# Patient Record
Sex: Female | Born: 1950
Health system: Southern US, Community
[De-identification: ages and names within clinical notes are randomized; demographics above are authoritative.]

## PROBLEM LIST (undated history)

## (undated) DIAGNOSIS — E559 Vitamin D deficiency, unspecified: Secondary | ICD-10-CM

## (undated) DIAGNOSIS — R200 Anesthesia of skin: Secondary | ICD-10-CM

## (undated) DIAGNOSIS — M1990 Unspecified osteoarthritis, unspecified site: Secondary | ICD-10-CM

## (undated) DIAGNOSIS — I10 Essential (primary) hypertension: Secondary | ICD-10-CM

## (undated) DIAGNOSIS — G4730 Sleep apnea, unspecified: Secondary | ICD-10-CM

## (undated) DIAGNOSIS — K219 Gastro-esophageal reflux disease without esophagitis: Secondary | ICD-10-CM

## (undated) DIAGNOSIS — E785 Hyperlipidemia, unspecified: Secondary | ICD-10-CM

## (undated) DIAGNOSIS — M545 Low back pain, unspecified: Secondary | ICD-10-CM

## (undated) DIAGNOSIS — E119 Type 2 diabetes mellitus without complications: Secondary | ICD-10-CM

## (undated) DIAGNOSIS — E039 Hypothyroidism, unspecified: Secondary | ICD-10-CM

## (undated) DIAGNOSIS — E7800 Pure hypercholesterolemia, unspecified: Secondary | ICD-10-CM

## (undated) HISTORY — DX: Low back pain: M54.5

## (undated) HISTORY — PX: OTHER SURGICAL HISTORY: SHX169

## (undated) HISTORY — DX: Gastro-esophageal reflux disease without esophagitis: K21.9

## (undated) HISTORY — PX: SALPINGECTOMY: SHX328

## (undated) HISTORY — DX: Hyperlipidemia, unspecified: E78.5

## (undated) HISTORY — DX: Low back pain, unspecified: M54.50

## (undated) HISTORY — DX: Vitamin D deficiency, unspecified: E55.9

## (undated) HISTORY — PX: HAMMER TOE SURGERY: SHX385

## (undated) HISTORY — DX: Pure hypercholesterolemia, unspecified: E78.00

---

## 1979-10-13 HISTORY — PX: ABDOMINAL HYSTERECTOMY: SHX81

## 1998-01-10 ENCOUNTER — Other Ambulatory Visit: Admission: RE | Admit: 1998-01-10 | Discharge: 1998-01-10 | Payer: Self-pay | Admitting: Endocrinology

## 1999-12-15 ENCOUNTER — Ambulatory Visit (HOSPITAL_COMMUNITY): Admission: RE | Admit: 1999-12-15 | Discharge: 1999-12-15 | Payer: Self-pay | Admitting: *Deleted

## 1999-12-15 ENCOUNTER — Encounter (INDEPENDENT_AMBULATORY_CARE_PROVIDER_SITE_OTHER): Payer: Self-pay | Admitting: *Deleted

## 2000-02-08 ENCOUNTER — Ambulatory Visit: Admission: RE | Admit: 2000-02-08 | Discharge: 2000-02-08 | Payer: Self-pay | Admitting: Endocrinology

## 2001-02-03 ENCOUNTER — Other Ambulatory Visit: Admission: RE | Admit: 2001-02-03 | Discharge: 2001-02-03 | Payer: Self-pay | Admitting: Endocrinology

## 2001-06-23 ENCOUNTER — Encounter: Payer: Self-pay | Admitting: Endocrinology

## 2001-06-23 ENCOUNTER — Ambulatory Visit (HOSPITAL_COMMUNITY): Admission: RE | Admit: 2001-06-23 | Discharge: 2001-06-23 | Payer: Self-pay | Admitting: Endocrinology

## 2001-12-25 ENCOUNTER — Ambulatory Visit (HOSPITAL_BASED_OUTPATIENT_CLINIC_OR_DEPARTMENT_OTHER): Admission: RE | Admit: 2001-12-25 | Discharge: 2001-12-25 | Payer: Self-pay | Admitting: Pulmonary Disease

## 2002-03-21 ENCOUNTER — Encounter: Payer: Self-pay | Admitting: Endocrinology

## 2002-03-21 ENCOUNTER — Encounter: Admission: RE | Admit: 2002-03-21 | Discharge: 2002-03-21 | Payer: Self-pay | Admitting: Endocrinology

## 2002-04-24 ENCOUNTER — Encounter: Payer: Self-pay | Admitting: Internal Medicine

## 2002-04-24 ENCOUNTER — Encounter: Admission: RE | Admit: 2002-04-24 | Discharge: 2002-04-24 | Payer: Self-pay | Admitting: Internal Medicine

## 2002-05-30 ENCOUNTER — Encounter (INDEPENDENT_AMBULATORY_CARE_PROVIDER_SITE_OTHER): Payer: Self-pay | Admitting: Specialist

## 2002-05-30 ENCOUNTER — Ambulatory Visit (HOSPITAL_COMMUNITY): Admission: RE | Admit: 2002-05-30 | Discharge: 2002-05-30 | Payer: Self-pay | Admitting: *Deleted

## 2002-05-30 ENCOUNTER — Encounter: Payer: Self-pay | Admitting: *Deleted

## 2004-05-26 ENCOUNTER — Other Ambulatory Visit: Admission: RE | Admit: 2004-05-26 | Discharge: 2004-05-26 | Payer: Self-pay | Admitting: Endocrinology

## 2004-05-30 ENCOUNTER — Encounter (INDEPENDENT_AMBULATORY_CARE_PROVIDER_SITE_OTHER): Payer: Self-pay | Admitting: *Deleted

## 2004-05-30 ENCOUNTER — Ambulatory Visit (HOSPITAL_COMMUNITY): Admission: RE | Admit: 2004-05-30 | Discharge: 2004-05-30 | Payer: Self-pay | Admitting: *Deleted

## 2005-09-08 ENCOUNTER — Encounter (INDEPENDENT_AMBULATORY_CARE_PROVIDER_SITE_OTHER): Payer: Self-pay | Admitting: *Deleted

## 2005-09-08 ENCOUNTER — Ambulatory Visit (HOSPITAL_COMMUNITY): Admission: RE | Admit: 2005-09-08 | Discharge: 2005-09-08 | Payer: Self-pay | Admitting: *Deleted

## 2007-06-03 ENCOUNTER — Other Ambulatory Visit: Admission: RE | Admit: 2007-06-03 | Discharge: 2007-06-03 | Payer: Self-pay | Admitting: Endocrinology

## 2007-12-30 ENCOUNTER — Encounter (INDEPENDENT_AMBULATORY_CARE_PROVIDER_SITE_OTHER): Payer: Self-pay | Admitting: *Deleted

## 2007-12-30 ENCOUNTER — Ambulatory Visit (HOSPITAL_COMMUNITY): Admission: RE | Admit: 2007-12-30 | Discharge: 2007-12-30 | Payer: Self-pay | Admitting: *Deleted

## 2008-06-06 ENCOUNTER — Other Ambulatory Visit: Admission: RE | Admit: 2008-06-06 | Discharge: 2008-06-06 | Payer: Self-pay | Admitting: Endocrinology

## 2009-02-20 ENCOUNTER — Encounter: Admission: RE | Admit: 2009-02-20 | Discharge: 2009-02-20 | Payer: Self-pay | Admitting: Endocrinology

## 2009-06-10 ENCOUNTER — Other Ambulatory Visit: Admission: RE | Admit: 2009-06-10 | Discharge: 2009-06-10 | Payer: Self-pay | Admitting: Endocrinology

## 2010-05-12 ENCOUNTER — Encounter: Admission: RE | Admit: 2010-05-12 | Discharge: 2010-05-12 | Payer: Self-pay | Admitting: Endocrinology

## 2010-07-21 ENCOUNTER — Ambulatory Visit: Payer: Self-pay | Admitting: Radiology

## 2010-07-21 ENCOUNTER — Emergency Department (HOSPITAL_BASED_OUTPATIENT_CLINIC_OR_DEPARTMENT_OTHER): Admission: EM | Admit: 2010-07-21 | Discharge: 2010-07-21 | Payer: Self-pay | Admitting: Emergency Medicine

## 2010-07-29 ENCOUNTER — Other Ambulatory Visit: Admission: RE | Admit: 2010-07-29 | Discharge: 2010-07-29 | Payer: Self-pay | Admitting: Endocrinology

## 2010-12-25 LAB — DIFFERENTIAL
Basophils Absolute: 0.2 10*3/uL — ABNORMAL HIGH (ref 0.0–0.1)
Basophils Relative: 3 % — ABNORMAL HIGH (ref 0–1)
Monocytes Relative: 8 % (ref 3–12)
Neutro Abs: 2.4 10*3/uL (ref 1.7–7.7)
Neutrophils Relative %: 53 % (ref 43–77)

## 2010-12-25 LAB — CBC
HCT: 39.6 % (ref 36.0–46.0)
MCH: 28.1 pg (ref 26.0–34.0)
MCV: 87.6 fL (ref 78.0–100.0)
RDW: 12.6 % (ref 11.5–15.5)
WBC: 4.8 10*3/uL (ref 4.0–10.5)

## 2010-12-25 LAB — COMPREHENSIVE METABOLIC PANEL
Alkaline Phosphatase: 95 U/L (ref 39–117)
BUN: 8 mg/dL (ref 6–23)
Creatinine, Ser: 0.5 mg/dL (ref 0.4–1.2)
Glucose, Bld: 134 mg/dL — ABNORMAL HIGH (ref 70–99)
Potassium: 4.7 mEq/L (ref 3.5–5.1)
Total Bilirubin: 0.9 mg/dL (ref 0.3–1.2)
Total Protein: 8.5 g/dL — ABNORMAL HIGH (ref 6.0–8.3)

## 2010-12-25 LAB — POCT CARDIAC MARKERS: Troponin i, poc: <0.05 ng/mL (ref 0.00–0.09)

## 2010-12-25 LAB — LIPASE, BLOOD: Lipase: 158 U/L (ref 23–300)

## 2011-02-24 NOTE — Op Note (Signed)
NAMEBRYONNA, SUNDBY                ACCOUNT NO.:  [id number]   MEDICAL RECORD NO.:  [id number]          PATIENT TYPE:  AMB   LOCATION:  ENDO                         FACILITY:  [facility]   PHYSICIAN:  Georgiana Spinner, M.D.    DATE OF BIRTH:  1951/03/12   DATE OF PROCEDURE:  12/30/2007  DATE OF DISCHARGE:                               OPERATIVE REPORT   PROCEDURE:  Upper endoscopy.   INDICATIONS:  GERD.   ANESTHESIA:  Fentanyl 50 mcg, Versed 8 mg.   PROCEDURE:  With the patient mildly sedated in the left lateral  decubitus, the position the Pentax videoscopic endoscope was inserted in  the mouth and passed under direct vision through the esophagus into the  stomach.  Fundus, body, antrum, duodenal bulb, second portion duodenum  were visualized.  From this point the endoscope was slowly withdrawn  taking circumferential views of the duodenal mucosa until the endoscope  had been pulled back into the stomach and placed in retroflexion to view  the stomach from below.  The endoscope was then straightened and  withdrawn taking circumferential views of the remaining gastric and  esophageal mucosa stopping in the fundus of the stomach where erythema  was seen photographed and biopsied.  We then stopped in the distal  esophagus where I could not get the esophagus to fully open but I  thought there could be Barrett's esophagus, photographed and biopsied.  The endoscope was withdrawn.  The patient's vital signs and pulse  oximeter remained stable.  The patient tolerated the procedure well  without apparent complication.   FINDINGS:  Question of Barrett's esophagus, erythema of gastric fundus,  await biopsy reports.  The patient will call me for results and follow  up with me as an outpatient.           ______________________________  Georgiana Spinner, M.D.     GMO/MEDQ  D:  12/30/2007  T:  12/30/2007  Job:  [id number]

## 2011-02-27 NOTE — Op Note (Signed)
NAMETACY, CHAVIS                ACCOUNT NO.:  [id number]   MEDICAL RECORD NO.:  [id number]          PATIENT TYPE:  AMB   LOCATION:  ENDO                         FACILITY:  [facility]   PHYSICIAN:  Georgiana Spinner, M.D.    DATE OF BIRTH:  1951-04-16   DATE OF PROCEDURE:  09/08/2005  DATE OF DISCHARGE:                                 OPERATIVE REPORT   PROCEDURE:  Upper endoscopy with biopsy.   INDICATIONS:  GERD.   ANESTHESIA:  Demerol 60 Versed 6 mg.   PROCEDURE:  With the patient mildly sedated in the left lateral decubitus  position, the Olympus videoscopic endoscope was inserted into the mouth and  passed, under direct vision, through the esophagus which appeared normal  until it reached the distal esophagus; and there was a question of  Barrett's; photographed and biopsied. We entered into the stomach, fundus,  body, antrum, duodenal bulb, second portion of the duodenum and visualized.  From this point the endoscope was slowly withdrawn taking circumferential  views of the duodenal mucosa until the endoscope had been pulled back into  the stomach and placed on retroflexed view of the stomach from below. The  endoscope was straightened, and withdrawn taking circumferential views of  the mid gastric and esophageal mucosa; stopping to biopsy a question of a  polyp on the __________.  The endoscope was then withdrawn. The patient's  vital signs and pulse oximetry remained stable. The patient tolerated the  procedure well without complications.   Question of Barrett's esophagus __________ .  Await biopsy reports. The  patient will call me for results and followup with me as an outpatient.           ______________________________  Georgiana Spinner, M.D.     GMO/MEDQ  D:  09/08/2005  T:  09/08/2005  Job:  [id number]

## 2011-02-27 NOTE — Op Note (Signed)
NAME:  Jean Hamilton, Jean Hamilton                          ACCOUNT NO.:  [id number]   MEDICAL RECORD NO.:  [id number]                   PATIENT TYPE:  AMB   LOCATION:  ENDO                                 FACILITY:  [facility]   PHYSICIAN:  Georgiana Spinner, M.D.                 DATE OF BIRTH:  [birth year]   DATE OF PROCEDURE:  DATE OF DISCHARGE:                                 OPERATIVE REPORT   PROCEDURE:  Upper endoscopy with biopsy.   INDICATIONS:  Barrett's.   ANESTHESIA:  Demerol 50, Versed 5 mg.   PROCEDURE:  With the patient mildly sedated in the left lateral decubitus  position, the Olympus videoscopic endoscope was inserted into the mouth,  passed under good occlusion through the esophagus, which appeared normal,  until we reached the distal esophagus.  There were changes of Barrett's.  Photographed and biopsied.  We entered into the stomach.  The fundus, body,  antrum, duodenal bulb, and second portion of the duodenum were visualized.  From this point, the endoscope was slowly withdrawn, taking circumferential  views of the duodenal mucosa.  The endoscope was pulled back into the  stomach and placed in retroflexion, viewing the stomach from below.  The  endoscope was straightened and withdrawn, taking circumferential views of  the remaining gastric and esophageal mucosa.  Patient's vital signs and  pulse oximeter remained stable.  The patient tolerated the procedure well  without apparent complications.   FINDINGS:  Barrett's esophagus, biopsied.   Await biopsy report.  Patient is to call me for the results and follow up  with me as an outpatient.                                               Georgiana Spinner, M.D.    GMO/MEDQ  D:  05/30/2004  T:  05/30/2004  Job:  [id number]

## 2011-02-27 NOTE — Op Note (Signed)
NAME:  Jean Hamilton, Jean Hamilton                          ACCOUNT NO.:  [id number]   MEDICAL RECORD NO.:  [id number]                   PATIENT TYPE:  AMB   LOCATION:  ENDO                                 [facility]ACILITY:  [facility]   PHYSICIAN:  Georgiana Spinner, M.D.                 DATE OF BIRTH:  18-May-1951   DATE OF PROCEDURE:  DATE OF DISCHARGE:                                 OPERATIVE REPORT   PROCEDURE:  Colonoscopy.   INDICATIONS:  Colon cancer screening.   ANESTHESIA:  Demerol 30, Versed 3 mg.   PROCEDURE:  With the patient mildly sedated in the left lateral decubitus  position, the Olympus videoscopic colonoscope was inserted into the rectum  and passed through a somewhat tortuous colon to the cecum, identified by  ileocecal valve and appendiceal orifice, both of which were photographed.  From this point, the colonoscope was then slowly withdrawn, taking  circumferential views of the colonic mucosa, stopping only in the rectum,  which appeared normal on direct and showed hemorrhoids on retroflex view.  The endoscopy was straightened and withdrawn.  Patient's vital signs and  pulse oximetry remained stable.  Patient tolerated the procedure well  without apparent complications.   FINDINGS:  Internal hemorrhoids, otherwise unremarkable colonoscopic  examination.   PLAN:  See endoscopy note for further details.                                               Georgiana Spinner, M.D.    GMO/MEDQ  D:  05/30/2004  T:  05/30/2004  Job:  [id number]

## 2011-02-27 NOTE — Procedures (Signed)
[place]  Patient:    Jean Hamilton, Jean Hamilton                       MRN: [id number] Proc. Date: 12/15/99 Adm. Date:  [id number] Attending:  Sabino Gasser                           Procedure Report  PROCEDURE:  Upper endoscopy with biopsy.  INDICATIONS:  Reflux symptomatology.  ANESTHESIA:  Demerol 50 mg, Versed 5 mg was given intravenously in divided dose.  PROCEDURE:  With patient mildly sedated in the left lateral decubitus position, the Olympus videoscopic endoscope was inserted in the mouth and passed under direct  vision through the esophagus, which appeared normal until we reached the distal  esophagus, where there was a question of one area of Barretts esophagus, photographed and biopsied.  When entered into the stomach, the antrum was approached, appeared normal, as did duodenal bulb, second portion of duodenum.  From this point, the endoscope was slowly withdrawn, taking circumferential views of the entire duodenal mucosa until the endoscope had been pulled back into the  stomach, placed on retroflexion to view the stomach from below and there was a question of a small hiatal hernia as evidenced by incomplete wrap of the GE junction around the endoscope.  The endoscope was straightened and withdrawn taking circumferential views of the entire gastric and subsequently esophageal mucosa nd there was the beginning of possible and early stricture formation photographed.  The endoscope was then withdrawn.  Patients vital signs and pulse oximeter remained stable.  The patient tolerated the procedure well without apparent complications.  FINDINGS:  Question of small hiatal hernia.  Question of Barretts esophagus. Await biopsy report.  Will have patient follow up with me as an outpatient. DD:  12/15/99 TD:  12/15/99 Job: [id number]

## 2011-10-29 IMAGING — CR DG CHEST 2V
2 series · 2 of 2 positions shown · non-contrast
Comparison: None.

CLINICAL DATA: Left chest pain

CHEST - 2 VIEW

[w chest pa]
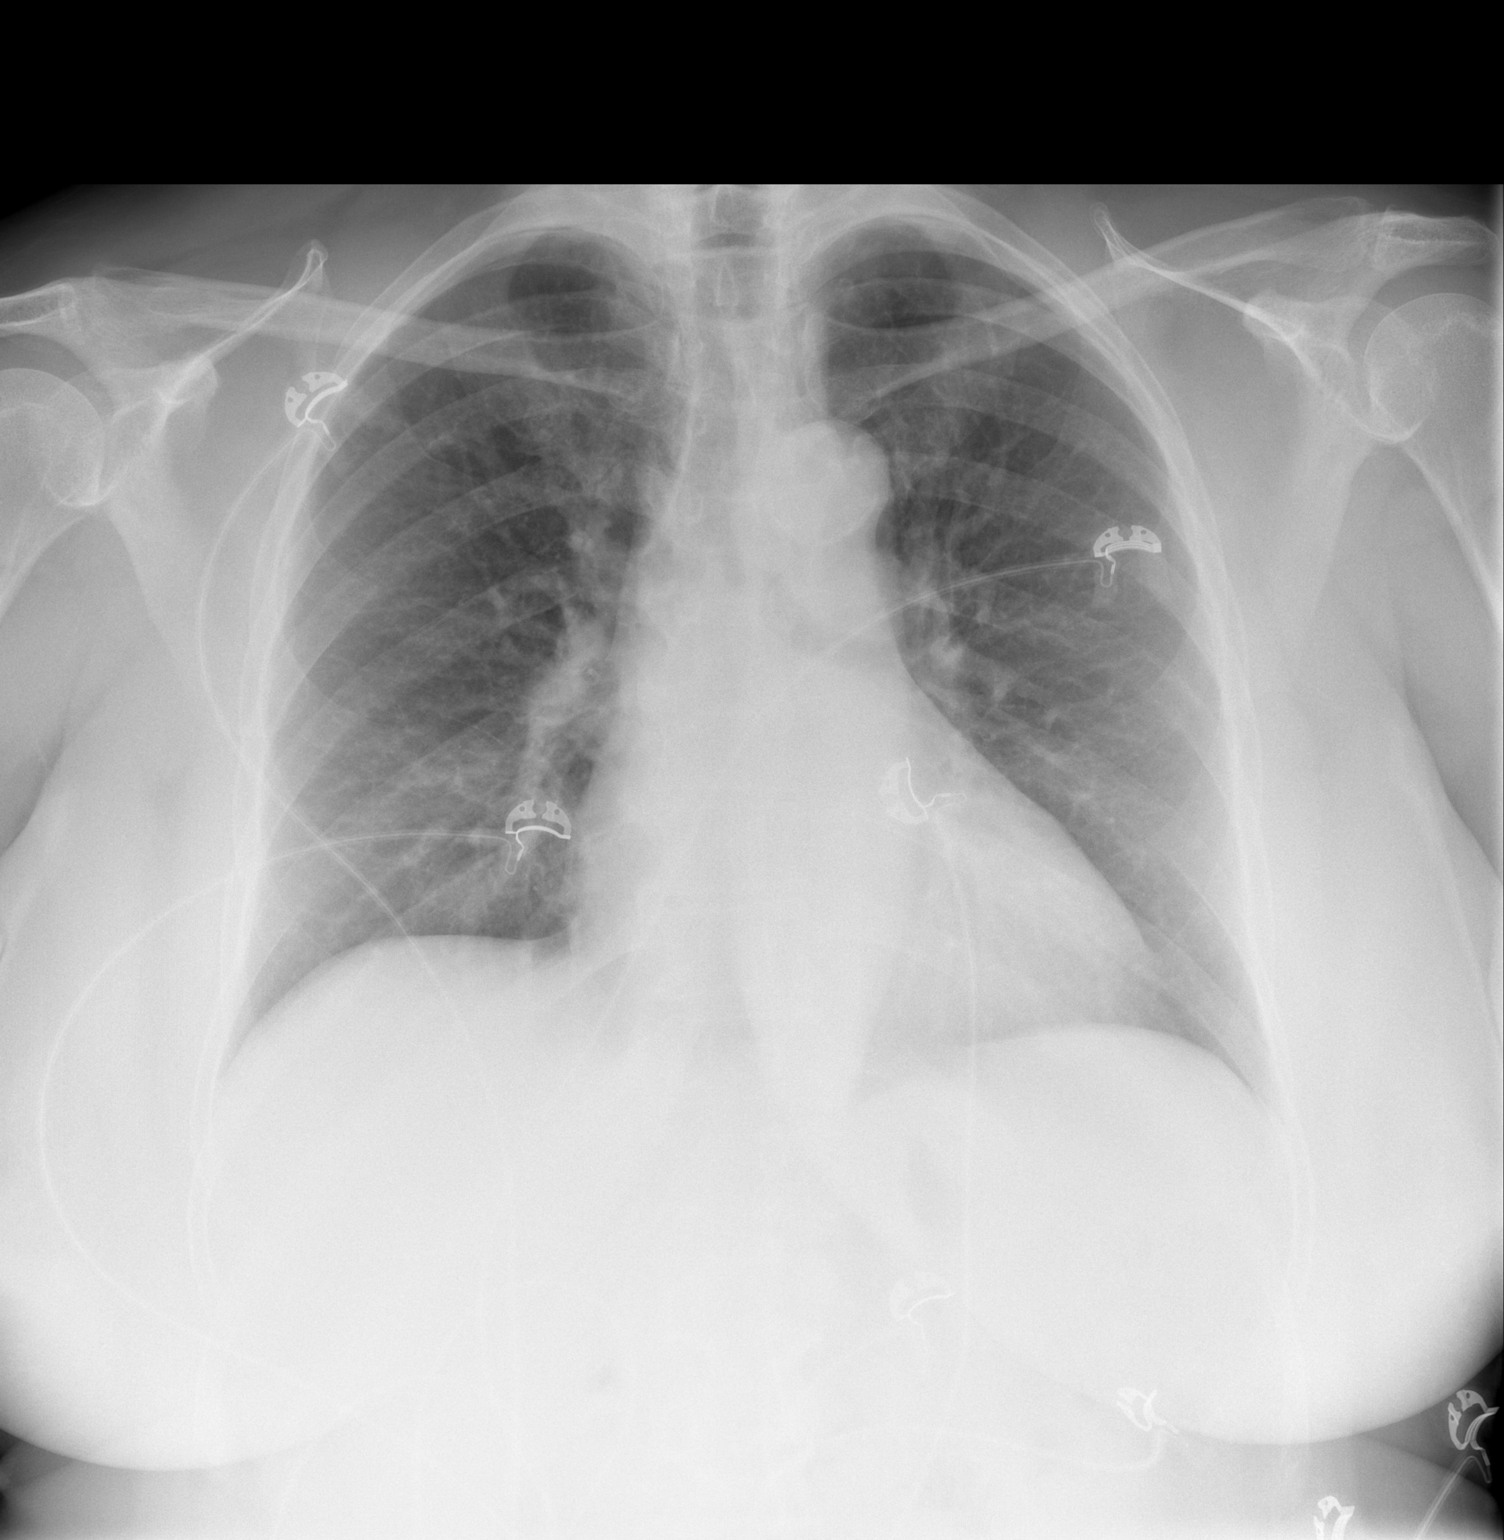

[w chest lat]
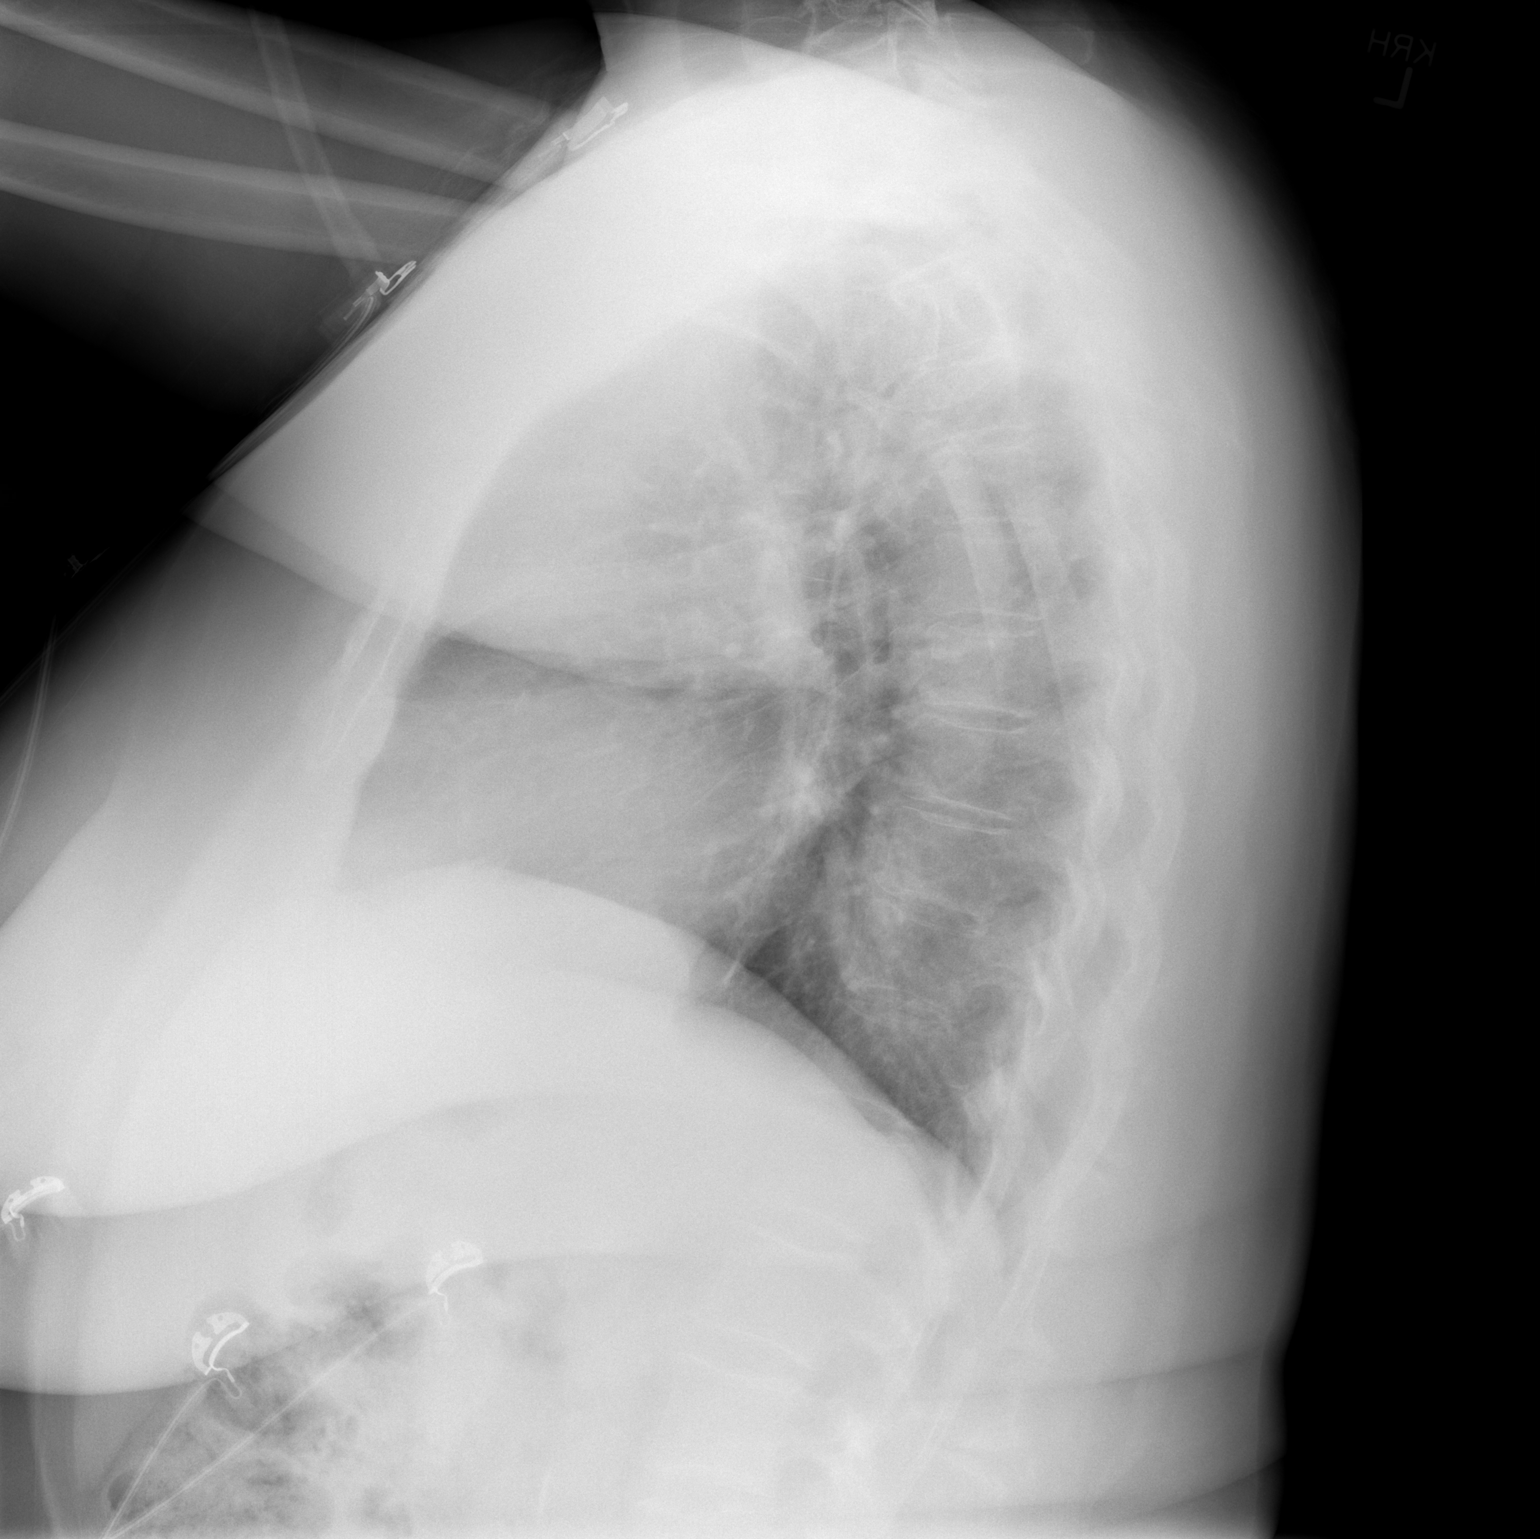

[2 of 2 positions shown; findings below may reference images not displayed]

FINDINGS: Heart size slightly increased .  Lungs clear.  No
congestive heart failure or pleural fluid.  Osseous structures
intact.
IMPRESSION: Slight cardiomegaly - no active disease.

## 2013-10-31 ENCOUNTER — Other Ambulatory Visit: Payer: Self-pay | Admitting: Endocrinology

## 2013-10-31 DIAGNOSIS — M5430 Sciatica, unspecified side: Secondary | ICD-10-CM

## 2013-11-08 ENCOUNTER — Ambulatory Visit
Admission: RE | Admit: 2013-11-08 | Discharge: 2013-11-08 | Disposition: A | Discharge status: Home / Self Care | Payer: Managed Care, Other (non HMO) | Source: Ambulatory Visit | Attending: Endocrinology | Admitting: Endocrinology

## 2013-11-08 DIAGNOSIS — M5430 Sciatica, unspecified side: Secondary | ICD-10-CM

## 2013-12-22 NOTE — Patient Instructions (Signed)
[place]  12/22/2013   Your procedure is scheduled on: 01/03/14  Report to [facility] at 7:10 AM.  Call this number if you have problems the morning of surgery 336-: [phone]   Remember:   Do not eat food or drink liquids After Midnight.     Take these medicines the morning of surgery with A SIP OF WATER:    Do not wear jewelry, make-up or nail polish.  Do not wear lotions, powders, or perfumes. You may wear deodorant.  Do not shave 48 hours prior to surgery. Men may shave face and neck.  Do not bring valuables to the hospital.  Contacts, dentures or bridgework may not be worn into surgery.  Leave suitcase in the car. After surgery it may be brought to your room.  For patients admitted to the hospital, checkout time is 11:00 AM the day of discharge.    Please read over the following fact sheets that you were given:[place] preparing for surgery sheet, MRSA Information, incentive spirometry fact sheet, blood fact sheet Paulette Blanch, RN  pre op nurse call if needed [phone]    FAILURE TO [place]   Patient Signature: ___________________________________________

## 2013-12-22 NOTE — Progress Notes (Signed)
Surgery clearance note Dr. Wilson Singer 12/15/13 on chart, EKG 12/15/13 on chart

## 2013-12-22 NOTE — Progress Notes (Signed)
Surgery on 01/03/13.  Preop on 12/25/13 at 0900am.  Need orders in EPIC.  Thank You.

## 2013-12-25 ENCOUNTER — Inpatient Hospital Stay (HOSPITAL_COMMUNITY)
Admission: RE | Admit: 2013-12-25 | Discharge: 2013-12-25 | Disposition: A | Discharge status: Home / Self Care | Payer: Managed Care, Other (non HMO) | Source: Ambulatory Visit

## 2013-12-25 ENCOUNTER — Encounter (HOSPITAL_COMMUNITY): Payer: Self-pay | Admitting: Pharmacy Technician

## 2013-12-28 ENCOUNTER — Other Ambulatory Visit (HOSPITAL_COMMUNITY): Payer: Self-pay | Admitting: Orthopedic Surgery

## 2013-12-28 ENCOUNTER — Ambulatory Visit (HOSPITAL_COMMUNITY)
Admission: RE | Admit: 2013-12-28 | Discharge: 2013-12-28 | Disposition: A | Discharge status: Home / Self Care | Payer: Managed Care, Other (non HMO) | Source: Ambulatory Visit | Attending: Surgical | Admitting: Surgical

## 2013-12-28 ENCOUNTER — Encounter (HOSPITAL_COMMUNITY): Payer: Self-pay

## 2013-12-28 ENCOUNTER — Encounter (HOSPITAL_COMMUNITY)
Admission: RE | Admit: 2013-12-28 | Discharge: 2013-12-28 | Disposition: A | Discharge status: Home / Self Care | Payer: Managed Care, Other (non HMO) | Source: Ambulatory Visit | Attending: Orthopedic Surgery | Admitting: Orthopedic Surgery

## 2013-12-28 DIAGNOSIS — Z01818 Encounter for other preprocedural examination: Secondary | ICD-10-CM | POA: Insufficient documentation

## 2013-12-28 DIAGNOSIS — I10 Essential (primary) hypertension: Secondary | ICD-10-CM | POA: Insufficient documentation

## 2013-12-28 DIAGNOSIS — Z01812 Encounter for preprocedural laboratory examination: Secondary | ICD-10-CM | POA: Insufficient documentation

## 2013-12-28 HISTORY — DX: Gastro-esophageal reflux disease without esophagitis: K21.9

## 2013-12-28 HISTORY — DX: Essential (primary) hypertension: I10

## 2013-12-28 HISTORY — DX: Unspecified osteoarthritis, unspecified site: M19.90

## 2013-12-28 HISTORY — DX: Sleep apnea, unspecified: G47.30

## 2013-12-28 HISTORY — DX: Hypothyroidism, unspecified: E03.9

## 2013-12-28 HISTORY — DX: Type 2 diabetes mellitus without complications: E11.9

## 2013-12-28 HISTORY — DX: Anesthesia of skin: R20.0

## 2013-12-28 LAB — URINALYSIS, ROUTINE W REFLEX MICROSCOPIC
Bilirubin Urine: NEGATIVE
Glucose, UA: NEGATIVE mg/dL
Hgb urine dipstick: NEGATIVE
Ketones, ur: NEGATIVE mg/dL
Leukocytes, UA: NEGATIVE
Nitrite: NEGATIVE
Protein, ur: NEGATIVE mg/dL
Specific Gravity, Urine: 1.013 (ref 1.005–1.030)
Urobilinogen, UA: 0.2 mg/dL (ref 0.0–1.0)
pH: 5.5 (ref 5.0–8.0)

## 2013-12-28 LAB — CBC
HCT: 35.8 % — ABNORMAL LOW (ref 36.0–46.0)
Hemoglobin: 12.3 g/dL (ref 12.0–15.0)
MCH: 28.9 pg (ref 26.0–34.0)
MCHC: 34.4 g/dL (ref 30.0–36.0)
MCV: 84 fL (ref 78.0–100.0)
Platelets: 167 10*3/uL (ref 150–400)
RBC: 4.26 MIL/uL (ref 3.87–5.11)
RDW: 12.7 % (ref 11.5–15.5)
WBC: 6.2 10*3/uL (ref 4.0–10.5)

## 2013-12-28 LAB — COMPREHENSIVE METABOLIC PANEL
ALT: 27 U/L (ref 0–35)
AST: 27 U/L (ref 0–37)
Albumin: 4.1 g/dL (ref 3.5–5.2)
Alkaline Phosphatase: 69 U/L (ref 39–117)
BUN: 10 mg/dL (ref 6–23)
CO2: 24 mEq/L (ref 19–32)
Calcium: 10.1 mg/dL (ref 8.4–10.5)
Chloride: 101 mEq/L (ref 96–112)
Creatinine, Ser: 0.64 mg/dL (ref 0.50–1.10)
GFR calc Af Amer: >90 mL/min (ref >90)
GFR calc non Af Amer: >90 mL/min (ref >90)
Glucose, Bld: 106 mg/dL — ABNORMAL HIGH (ref 70–99)
Potassium: 4.3 mEq/L (ref 3.7–5.3)
Sodium: 139 mEq/L (ref 137–147)
Total Bilirubin: 0.3 mg/dL (ref 0.3–1.2)
Total Protein: 8.4 g/dL — ABNORMAL HIGH (ref 6.0–8.3)

## 2013-12-28 LAB — SURGICAL PCR SCREEN
MRSA, PCR: NEGATIVE
STAPHYLOCOCCUS AUREUS: NEGATIVE

## 2013-12-28 LAB — PROTIME-INR
INR: 1.03 (ref 0.00–1.49)
Prothrombin Time: 13.3 seconds (ref 11.6–15.2)

## 2013-12-28 LAB — APTT: aPTT: 31 seconds (ref 24–37)

## 2013-12-28 NOTE — Progress Notes (Signed)
ekg 12-15-13 dr Wilson Singer on chart Medical clearance note dr Wilson Singer 12-15-13 on chart

## 2013-12-28 NOTE — Patient Instructions (Signed)
[place]  12/28/2013   Your procedure is scheduled on:  Wednesday March 25th  Report to [facility] at  710 AM.  Call this number if you have problems the morning of surgery [phone]   Remember:  Do not eat food or drink liquids :After Midnight.     Take these medicines the morning of surgery with A SIP OF WATER: Nexium, Synthroid, Simvastatin                                [place]             You may not have any metal on your body including hair pins and piercings  Do not wear jewelry, make-up.  Do not wear lotions, powders, or perfumes. No  Deodorant is to be worn.   Men may shave face and neck.  Do not bring valuables to the hospital. [place].  Contacts, dentures or bridgework may not be worn into surgery.  Leave suitcase in the car. After surgery it may be brought to your room.  For patients admitted to the hospital, checkout time is 11:00 AM the day of discharge.    Please read over the following fact sheets that you were given: [facility] Preparing for surgery sheet,  incentive spirometer sheet, blood fact sheet, MRSA information  Call Zelphia Cairo RN pre op nurse if needed [phone]    [place].  PATIENT SIGNATURE___________________________________________  NURSE SIGNATURE_____________________________________________

## 2014-01-01 NOTE — H&P (Signed)
TOTAL HIP ADMISSION H&P  Patient is admitted for right total hip arthroplasty.  Subjective:  Chief Complaint: right hip pain  HPI: Jean Hamilton, 63 y.o. female, has a history of pain and functional disability in the right hip(s) due to arthritis and patient has failed non-surgical conservative treatments for greater than 12 weeks to include NSAID's and/or analgesics, corticosteriod injections, use of assistive devices and activity modification.  Onset of symptoms was gradual starting 4 years ago with gradually worsening course since that time.The patient noted no past surgery on the right hip(s).  Patient currently rates pain in the right hip at 8 out of 10 with activity. Patient has night pain, worsening of pain with activity and weight bearing, pain that interfers with activities of daily living, pain with passive range of motion and crepitus. Patient has evidence of periarticular osteophytes and joint space narrowing by imaging studies. This condition presents safety issues increasing the risk of falls.  There is no current active infection.   Past Medical History  Diagnosis Date  . Hypertension   . Sleep apnea     no cpap use since weight loss about 10 yrs ago  . Hypothyroidism   . Diabetes mellitus without complication   . GERD (gastroesophageal reflux disease)   . Arthritis     oa  . Numbness last few weeks    left index finger    Past Surgical History  Procedure Laterality Date  . Abdominal hysterectomy  1981    1 ovary removed  . Ganglion cyst removed Right yrs ago    wrist  . Hammer toe surgery Left few yrs ago    and left foot surgery     Current outpatient prescriptions: amLODipine-valsartan (EXFORGE) 5-320 MG per tablet, Take 1 tablet by mouth every morning., Disp: , Rfl: ;   aspirin EC 81 MG tablet, Take 81 mg by mouth daily., Disp: , Rfl: ;   Calcium-Magnesium-Zinc 500-250-12.5 MG TABS, Take 1 tablet by mouth daily., Disp: , Rfl: ;   cholecalciferol (VITAMIN D)  1000 UNITS tablet, Take 1,000 Units by mouth daily., Disp: , Rfl:  esomeprazole (NEXIUM) 20 MG capsule, Take 20 mg by mouth daily at 12 noon., Disp: , Rfl: ;   HYDROcodone-acetaminophen (NORCO/VICODIN) 5-325 MG per tablet, Take 1 tablet by mouth 4 (four) times daily as needed for moderate pain., Disp: , Rfl: ;   levothyroxine (SYNTHROID, LEVOTHROID) 150 MCG tablet, Take 150 mcg by mouth daily before breakfast., Disp: , Rfl:  metFORMIN (GLUCOPHAGE-XR) 500 MG 24 hr tablet, Take 1,000 mg by mouth daily with breakfast., Disp: , Rfl: ;   potassium gluconate 595 MG TABS tablet, Take 595 mg by mouth daily., Disp: , Rfl: ;   simvastatin (ZOCOR) 40 MG tablet, Take 40 mg by mouth every morning., Disp: , Rfl: ;  vitamin E 400 UNIT capsule, Take 400 Units by mouth daily., Disp: , Rfl:   Allergies  Allergen Reactions  . Sulfa Antibiotics Swelling    Fever     History  Substance Use Topics  . Smoking status: Former Smoker -- 0.25 packs/day for 20 years    Types: Cigarettes    Quit date: 10/12/1993  . Smokeless tobacco: Never Used  . Alcohol Use: No      Review of Systems  Constitutional: Negative.   HENT: Negative.   Eyes: Negative.   Respiratory: Negative.   Cardiovascular: Negative.   Gastrointestinal: Negative.   Genitourinary: Negative.   Musculoskeletal: Positive for back pain and joint  pain. Negative for falls, myalgias and neck pain.       Right hip pain  Skin: Negative.   Neurological: Negative.   Endo/Heme/Allergies: Negative.   Psychiatric/Behavioral: Negative.     Objective:  Physical Exam  Constitutional: She is oriented to person, place, and time. She appears well-developed and well-nourished. No distress.  HENT:  Head: Normocephalic and atraumatic.  Right Ear: External ear normal.  Left Ear: External ear normal.  Nose: Nose normal.  Mouth/Throat: Oropharynx is clear and moist.  Eyes: Conjunctivae and EOM are normal.  Neck: Normal range of motion. Neck supple.   Cardiovascular: Normal rate, regular rhythm, normal heart sounds and intact distal pulses.   No murmur heard. Respiratory: Effort normal and breath sounds normal.  GI: Soft. Bowel sounds are normal. She exhibits no distension. There is no tenderness.  Musculoskeletal:       Right hip: She exhibits decreased range of motion, decreased strength and crepitus.       Left hip: Normal.       Right knee: Normal.       Left knee: Normal.       Right lower leg: She exhibits no tenderness and no swelling.       Left lower leg: She exhibits no tenderness and no swelling.  Severe pain with passive and active motion of the right hip  Neurological: She is alert and oriented to person, place, and time. She has normal strength and normal reflexes. No sensory deficit.  Skin: No rash noted. She is not diaphoretic. No erythema.  Psychiatric: She has a normal mood and affect. Her behavior is normal.    Vitals Weight: 220 lb Height: 70 in Body Surface Area: 2.22 m Body Mass Index: 31.57 kg/m Pulse: 82 (Regular) BP: 151/89 (Sitting, Left Arm, Standard)  Imaging Review Plain radiographs demonstrate severe degenerative joint disease of the right hip(s). The bone quality appears to be adequate for age and reported activity level.  Assessment/Plan:  End stage arthritis, right hip(s)  The patient history, physical examination, clinical judgement of the provider and imaging studies are consistent with end stage degenerative joint disease of the right hip(s) and total hip arthroplasty is deemed medically necessary. The treatment options including medical management, injection therapy, arthroscopy and arthroplasty were discussed at length. The risks and benefits of total hip arthroplasty were presented and reviewed. The risks due to aseptic loosening, infection, stiffness, dislocation/subluxation,  thromboembolic complications and other imponderables were discussed.  The patient acknowledged the  explanation, agreed to proceed with the plan and consent was signed. Patient is being admitted for inpatient treatment for surgery, pain control, PT, OT, prophylactic antibiotics, VTE prophylaxis, progressive ambulation and ADL's and discharge planning.The patient is planning to be discharged to skilled nursing facility ([facility])    Ardeen Jourdain, [place]

## 2014-01-03 ENCOUNTER — Inpatient Hospital Stay (HOSPITAL_COMMUNITY)
Admission: RE | Admit: 2014-01-03 | Discharge: 2014-01-06 | DRG: 470 | Disposition: A | Discharge status: Skilled Nursing Facility | Payer: Managed Care, Other (non HMO) | Source: Ambulatory Visit | Attending: Orthopedic Surgery | Admitting: Orthopedic Surgery

## 2014-01-03 ENCOUNTER — Encounter (HOSPITAL_COMMUNITY): Payer: Managed Care, Other (non HMO) | Admitting: Certified Registered Nurse Anesthetist

## 2014-01-03 ENCOUNTER — Encounter (HOSPITAL_COMMUNITY): Payer: Self-pay

## 2014-01-03 ENCOUNTER — Ambulatory Visit (HOSPITAL_COMMUNITY): Payer: Managed Care, Other (non HMO) | Admitting: Certified Registered Nurse Anesthetist

## 2014-01-03 ENCOUNTER — Inpatient Hospital Stay (HOSPITAL_COMMUNITY): Payer: Managed Care, Other (non HMO)

## 2014-01-03 ENCOUNTER — Encounter (HOSPITAL_COMMUNITY)
Admission: RE | Disposition: A | Discharge status: Skilled Nursing Facility | Payer: Self-pay | Source: Ambulatory Visit | Attending: Orthopedic Surgery

## 2014-01-03 DIAGNOSIS — Z6831 Body mass index (BMI) 31.0-31.9, adult: Secondary | ICD-10-CM

## 2014-01-03 DIAGNOSIS — E039 Hypothyroidism, unspecified: Secondary | ICD-10-CM | POA: Diagnosis present

## 2014-01-03 DIAGNOSIS — Z96641 Presence of right artificial hip joint: Secondary | ICD-10-CM

## 2014-01-03 DIAGNOSIS — K219 Gastro-esophageal reflux disease without esophagitis: Secondary | ICD-10-CM | POA: Diagnosis present

## 2014-01-03 DIAGNOSIS — R42 Dizziness and giddiness: Secondary | ICD-10-CM | POA: Diagnosis not present

## 2014-01-03 DIAGNOSIS — Z87891 Personal history of nicotine dependence: Secondary | ICD-10-CM

## 2014-01-03 DIAGNOSIS — E119 Type 2 diabetes mellitus without complications: Secondary | ICD-10-CM | POA: Diagnosis present

## 2014-01-03 DIAGNOSIS — E669 Obesity, unspecified: Secondary | ICD-10-CM | POA: Diagnosis present

## 2014-01-03 DIAGNOSIS — R209 Unspecified disturbances of skin sensation: Secondary | ICD-10-CM | POA: Diagnosis present

## 2014-01-03 DIAGNOSIS — I10 Essential (primary) hypertension: Secondary | ICD-10-CM | POA: Diagnosis present

## 2014-01-03 DIAGNOSIS — M1610 Unilateral primary osteoarthritis, unspecified hip: Principal | ICD-10-CM | POA: Diagnosis present

## 2014-01-03 DIAGNOSIS — D62 Acute posthemorrhagic anemia: Secondary | ICD-10-CM | POA: Diagnosis not present

## 2014-01-03 DIAGNOSIS — M169 Osteoarthritis of hip, unspecified: Principal | ICD-10-CM | POA: Diagnosis present

## 2014-01-03 DIAGNOSIS — G4730 Sleep apnea, unspecified: Secondary | ICD-10-CM | POA: Diagnosis present

## 2014-01-03 DIAGNOSIS — M1611 Unilateral primary osteoarthritis, right hip: Secondary | ICD-10-CM

## 2014-01-03 HISTORY — PX: TOTAL HIP ARTHROPLASTY: SHX124

## 2014-01-03 LAB — HEMOGLOBIN AND HEMATOCRIT, BLOOD
HCT: 28.2 % — ABNORMAL LOW (ref 36.0–46.0)
Hemoglobin: 9.6 g/dL — ABNORMAL LOW (ref 12.0–15.0)

## 2014-01-03 LAB — GLUCOSE, CAPILLARY
GLUCOSE-CAPILLARY: 186 mg/dL — AB (ref 70–99)
GLUCOSE-CAPILLARY: 153 mg/dL — AB (ref 70–99)
Glucose-Capillary: 106 mg/dL — ABNORMAL HIGH (ref 70–99)
Glucose-Capillary: 115 mg/dL — ABNORMAL HIGH (ref 70–99)
Glucose-Capillary: 162 mg/dL — ABNORMAL HIGH (ref 70–99)

## 2014-01-03 LAB — ABO/RH: ABO/RH(D): B POS

## 2014-01-03 SURGERY — ARTHROPLASTY, HIP, TOTAL,POSTERIOR APPROACH
Anesthesia: General | Site: Hip | Laterality: Right

## 2014-01-03 MED ORDER — ONDANSETRON HCL 4 MG/2ML IJ SOLN
4.0000 mg | Freq: Four times a day (QID) | INTRAMUSCULAR | Status: DC | PRN
  Administered 2014-01-03: 4 mg via INTRAVENOUS
  Filled 2014-01-03: qty 2

## 2014-01-03 MED ORDER — MEPERIDINE HCL 50 MG/ML IJ SOLN
6.2500 mg | INTRAMUSCULAR | Status: DC | PRN
Start: 2014-01-03 — End: 2014-01-03

## 2014-01-03 MED ORDER — PANTOPRAZOLE SODIUM 40 MG PO TBEC
40.0000 mg | DELAYED_RELEASE_TABLET | Freq: Every day | ORAL | Status: DC
  Administered 2014-01-04 – 2014-01-06 (×3): 40 mg via ORAL
  Filled 2014-01-03 (×3): qty 1

## 2014-01-03 MED ORDER — FENTANYL CITRATE 0.05 MG/ML IJ SOLN
INTRAMUSCULAR | Status: DC | PRN
  Administered 2014-01-03 (×3): 50 ug via INTRAVENOUS
  Administered 2014-01-03: 100 ug via INTRAVENOUS

## 2014-01-03 MED ORDER — HYDROMORPHONE HCL PF 1 MG/ML IJ SOLN
1.0000 mg | INTRAMUSCULAR | Status: DC | PRN
  Administered 2014-01-03: 1 mg via INTRAVENOUS
  Filled 2014-01-03: qty 1

## 2014-01-03 MED ORDER — FERROUS SULFATE 325 (65 FE) MG PO TABS
325.0000 mg | ORAL_TABLET | Freq: Three times a day (TID) | ORAL | Status: DC
  Administered 2014-01-04 – 2014-01-06 (×8): 325 mg via ORAL
  Filled 2014-01-03 (×11): qty 1

## 2014-01-03 MED ORDER — PHENOL 1.4 % MT LIQD: 1.0000 | OROMUCOSAL | Status: DC | PRN

## 2014-01-03 MED ORDER — PHENYLEPHRINE 40 MCG/ML (10ML) SYRINGE FOR IV PUSH (FOR BLOOD PRESSURE SUPPORT)
PREFILLED_SYRINGE | INTRAVENOUS | Status: AC
  Filled 2014-01-03: qty 10

## 2014-01-03 MED ORDER — CEFAZOLIN SODIUM 1-5 GM-% IV SOLN
1.0000 g | Freq: Four times a day (QID) | INTRAVENOUS | Status: AC
  Administered 2014-01-03 (×2): 1 g via INTRAVENOUS
  Filled 2014-01-03 (×2): qty 50

## 2014-01-03 MED ORDER — ACETAMINOPHEN 650 MG RE SUPP
650.0000 mg | Freq: Four times a day (QID) | RECTAL | Status: DC | PRN
Start: 2014-01-03 — End: 2014-01-06

## 2014-01-03 MED ORDER — ALUM & MAG HYDROXIDE-SIMETH 200-200-20 MG/5ML PO SUSP
30.0000 mL | ORAL | Status: DC | PRN
  Administered 2014-01-03: 30 mL via ORAL
  Filled 2014-01-03 (×2): qty 30

## 2014-01-03 MED ORDER — SODIUM CHLORIDE 0.9 % IJ SOLN
INTRAMUSCULAR | Status: AC
  Filled 2014-01-03: qty 10

## 2014-01-03 MED ORDER — PROMETHAZINE HCL 25 MG/ML IJ SOLN: 6.2500 mg | INTRAMUSCULAR | Status: DC | PRN

## 2014-01-03 MED ORDER — FLEET ENEMA 7-19 GM/118ML RE ENEM
1.0000 | ENEMA | Freq: Once | RECTAL | Status: AC | PRN
Start: 2014-01-03 — End: 2014-01-03

## 2014-01-03 MED ORDER — LACTATED RINGERS IV SOLN
INTRAVENOUS | Status: DC
  Administered 2014-01-03 (×3): via INTRAVENOUS

## 2014-01-03 MED ORDER — ATORVASTATIN CALCIUM 20 MG PO TABS
20.0000 mg | ORAL_TABLET | Freq: Every day | ORAL | Status: DC
  Administered 2014-01-04 – 2014-01-05 (×2): 20 mg via ORAL
  Filled 2014-01-03 (×3): qty 1

## 2014-01-03 MED ORDER — BISACODYL 10 MG RE SUPP: 10.0000 mg | Freq: Every day | RECTAL | Status: DC | PRN

## 2014-01-03 MED ORDER — AMLODIPINE BESYLATE 5 MG PO TABS
5.0000 mg | ORAL_TABLET | Freq: Every day | ORAL | Status: DC
  Administered 2014-01-04 – 2014-01-06 (×3): 5 mg via ORAL
  Filled 2014-01-03 (×3): qty 1

## 2014-01-03 MED ORDER — CEFAZOLIN SODIUM-DEXTROSE 2-3 GM-% IV SOLR
INTRAVENOUS | Status: AC
  Filled 2014-01-03: qty 50

## 2014-01-03 MED ORDER — METFORMIN HCL ER 500 MG PO TB24
1000.0000 mg | ORAL_TABLET | Freq: Every day | ORAL | Status: DC
  Administered 2014-01-04 – 2014-01-06 (×3): 1000 mg via ORAL
  Filled 2014-01-03 (×4): qty 2

## 2014-01-03 MED ORDER — PHENYLEPHRINE HCL 10 MG/ML IJ SOLN
INTRAMUSCULAR | Status: AC
  Filled 2014-01-03: qty 1

## 2014-01-03 MED ORDER — HYDROMORPHONE HCL PF 1 MG/ML IJ SOLN
INTRAMUSCULAR | Status: AC
  Filled 2014-01-03: qty 1

## 2014-01-03 MED ORDER — CEFAZOLIN SODIUM-DEXTROSE 2-3 GM-% IV SOLR
2.0000 g | INTRAVENOUS | Status: AC
Start: 2014-01-03 — End: 2014-01-03
  Administered 2014-01-03: 2 g via INTRAVENOUS

## 2014-01-03 MED ORDER — ROCURONIUM BROMIDE 100 MG/10ML IV SOLN
INTRAVENOUS | Status: AC
  Filled 2014-01-03: qty 1

## 2014-01-03 MED ORDER — HYDROMORPHONE HCL PF 2 MG/ML IJ SOLN
INTRAMUSCULAR | Status: AC
  Filled 2014-01-03: qty 1

## 2014-01-03 MED ORDER — SODIUM CHLORIDE 0.9 % IR SOLN
Status: DC | PRN
  Administered 2014-01-03: 08:00:00

## 2014-01-03 MED ORDER — POTASSIUM GLUCONATE 595 (99 K) MG PO TABS
595.0000 mg | ORAL_TABLET | Freq: Every day | ORAL | Status: DC
  Administered 2014-01-04 – 2014-01-06 (×3): 595 mg via ORAL
  Filled 2014-01-03 (×3): qty 1

## 2014-01-03 MED ORDER — LIDOCAINE HCL (CARDIAC) 20 MG/ML IV SOLN
INTRAVENOUS | Status: DC | PRN
  Administered 2014-01-03: 100 mg via INTRAVENOUS

## 2014-01-03 MED ORDER — ATROPINE SULFATE 0.4 MG/ML IJ SOLN
INTRAMUSCULAR | Status: AC
  Filled 2014-01-03: qty 1

## 2014-01-03 MED ORDER — ONDANSETRON HCL 4 MG PO TABS
4.0000 mg | ORAL_TABLET | Freq: Four times a day (QID) | ORAL | Status: DC | PRN
Start: 2014-01-03 — End: 2014-01-06

## 2014-01-03 MED ORDER — SIMVASTATIN 40 MG PO TABS: 40.0000 mg | ORAL_TABLET | Freq: Every morning | ORAL | Status: DC

## 2014-01-03 MED ORDER — ROCURONIUM BROMIDE 100 MG/10ML IV SOLN
INTRAVENOUS | Status: DC | PRN
  Administered 2014-01-03: 50 mg via INTRAVENOUS

## 2014-01-03 MED ORDER — SODIUM CHLORIDE 0.9 % IJ SOLN
INTRAMUSCULAR | Status: AC
  Filled 2014-01-03: qty 50

## 2014-01-03 MED ORDER — SODIUM CHLORIDE 0.9 % IV SOLN
INTRAVENOUS | Status: DC | PRN
  Administered 2014-01-03: 20 mL via INTRAMUSCULAR

## 2014-01-03 MED ORDER — ONDANSETRON HCL 4 MG/2ML IJ SOLN
INTRAMUSCULAR | Status: AC
  Filled 2014-01-03: qty 2

## 2014-01-03 MED ORDER — NEOSTIGMINE METHYLSULFATE 1 MG/ML IJ SOLN
INTRAMUSCULAR | Status: AC
  Filled 2014-01-03: qty 10

## 2014-01-03 MED ORDER — NEOSTIGMINE METHYLSULFATE 1 MG/ML IJ SOLN
INTRAMUSCULAR | Status: DC | PRN
  Administered 2014-01-03: 5 mg via INTRAVENOUS

## 2014-01-03 MED ORDER — DEXAMETHASONE SODIUM PHOSPHATE 10 MG/ML IJ SOLN
INTRAMUSCULAR | Status: AC
  Filled 2014-01-03: qty 1

## 2014-01-03 MED ORDER — IRBESARTAN 300 MG PO TABS
300.0000 mg | ORAL_TABLET | Freq: Every day | ORAL | Status: DC
  Administered 2014-01-04 – 2014-01-06 (×3): 300 mg via ORAL
  Filled 2014-01-03 (×3): qty 1

## 2014-01-03 MED ORDER — METHOCARBAMOL 500 MG PO TABS
500.0000 mg | ORAL_TABLET | Freq: Four times a day (QID) | ORAL | Status: DC | PRN
  Administered 2014-01-04 – 2014-01-05 (×2): 500 mg via ORAL
  Filled 2014-01-03 (×2): qty 1

## 2014-01-03 MED ORDER — BUPIVACAINE LIPOSOME 1.3 % IJ SUSP
20.0000 mL | Freq: Once | INTRAMUSCULAR | Status: AC
  Administered 2014-01-03: 20 mL
  Filled 2014-01-03: qty 20

## 2014-01-03 MED ORDER — LIDOCAINE HCL (CARDIAC) 20 MG/ML IV SOLN
INTRAVENOUS | Status: AC
  Filled 2014-01-03: qty 5

## 2014-01-03 MED ORDER — HYDROMORPHONE HCL PF 1 MG/ML IJ SOLN
INTRAMUSCULAR | Status: DC | PRN
  Administered 2014-01-03 (×2): 0.5 mg via INTRAVENOUS

## 2014-01-03 MED ORDER — POLYETHYLENE GLYCOL 3350 17 G PO PACK
17.0000 g | PACK | Freq: Every day | ORAL | Status: DC | PRN
  Filled 2014-01-03: qty 1

## 2014-01-03 MED ORDER — HYDROCODONE-ACETAMINOPHEN 5-325 MG PO TABS
1.0000 | ORAL_TABLET | ORAL | Status: DC | PRN
  Administered 2014-01-03: 2 via ORAL
  Administered 2014-01-03: 1 via ORAL
  Administered 2014-01-04: 2 via ORAL
  Administered 2014-01-04: 1 via ORAL
  Administered 2014-01-04: 2 via ORAL
  Administered 2014-01-04: 1 via ORAL
  Administered 2014-01-04: 2 via ORAL
  Administered 2014-01-05: 1 via ORAL
  Administered 2014-01-05 (×2): 2 via ORAL
  Administered 2014-01-06 (×2): 1 via ORAL
  Filled 2014-01-03 (×3): qty 2
  Filled 2014-01-03 (×2): qty 1
  Filled 2014-01-03 (×5): qty 2
  Filled 2014-01-03 (×3): qty 1

## 2014-01-03 MED ORDER — ACETAMINOPHEN 10 MG/ML IV SOLN
1000.0000 mg | Freq: Once | INTRAVENOUS | Status: AC
  Administered 2014-01-03: 1000 mg via INTRAVENOUS
  Filled 2014-01-03: qty 100

## 2014-01-03 MED ORDER — SUCCINYLCHOLINE CHLORIDE 20 MG/ML IJ SOLN
INTRAMUSCULAR | Status: DC | PRN
  Administered 2014-01-03: 120 mg via INTRAVENOUS

## 2014-01-03 MED ORDER — HYDROMORPHONE HCL PF 1 MG/ML IJ SOLN
0.2500 mg | INTRAMUSCULAR | Status: DC | PRN
  Administered 2014-01-03 (×3): 0.5 mg via INTRAVENOUS

## 2014-01-03 MED ORDER — PHENYLEPHRINE HCL 10 MG/ML IJ SOLN
INTRAMUSCULAR | Status: DC | PRN
  Administered 2014-01-03 (×7): 80 ug via INTRAVENOUS
  Administered 2014-01-03 (×2): 40 ug via INTRAVENOUS

## 2014-01-03 MED ORDER — GLYCOPYRROLATE 0.2 MG/ML IJ SOLN
INTRAMUSCULAR | Status: AC
  Filled 2014-01-03: qty 3

## 2014-01-03 MED ORDER — PROPOFOL 10 MG/ML IV BOLUS
INTRAVENOUS | Status: DC | PRN
  Administered 2014-01-03: 170 mg via INTRAVENOUS

## 2014-01-03 MED ORDER — INSULIN ASPART 100 UNIT/ML ~~LOC~~ SOLN
0.0000 [IU] | Freq: Three times a day (TID) | SUBCUTANEOUS | Status: DC
  Administered 2014-01-03 – 2014-01-05 (×2): 3 [IU] via SUBCUTANEOUS
  Administered 2014-01-06 (×2): 2 [IU] via SUBCUTANEOUS

## 2014-01-03 MED ORDER — THROMBIN 5000 UNITS EX SOLR
OROMUCOSAL | Status: DC | PRN
  Administered 2014-01-03: 08:00:00 via TOPICAL

## 2014-01-03 MED ORDER — THROMBIN 5000 UNITS EX SOLR
CUTANEOUS | Status: AC
  Filled 2014-01-03: qty 5000

## 2014-01-03 MED ORDER — METOCLOPRAMIDE HCL 5 MG/ML IJ SOLN
5.0000 mg | Freq: Four times a day (QID) | INTRAMUSCULAR | Status: DC | PRN
  Administered 2014-01-03: 10 mg via INTRAVENOUS
  Filled 2014-01-03 (×2): qty 2

## 2014-01-03 MED ORDER — RIVAROXABAN 10 MG PO TABS
10.0000 mg | ORAL_TABLET | Freq: Every day | ORAL | Status: DC
  Administered 2014-01-04 – 2014-01-06 (×3): 10 mg via ORAL
  Filled 2014-01-03 (×4): qty 1

## 2014-01-03 MED ORDER — EPHEDRINE SULFATE 50 MG/ML IJ SOLN
INTRAMUSCULAR | Status: AC
  Filled 2014-01-03: qty 1

## 2014-01-03 MED ORDER — PROPOFOL 10 MG/ML IV BOLUS
INTRAVENOUS | Status: AC
  Filled 2014-01-03: qty 20

## 2014-01-03 MED ORDER — MIDAZOLAM HCL 5 MG/5ML IJ SOLN
INTRAMUSCULAR | Status: DC | PRN
Start: 2014-01-03 — End: 2014-01-03
  Administered 2014-01-03: 2 mg via INTRAVENOUS

## 2014-01-03 MED ORDER — MIDAZOLAM HCL 2 MG/2ML IJ SOLN
INTRAMUSCULAR | Status: AC
  Filled 2014-01-03: qty 2

## 2014-01-03 MED ORDER — FENTANYL CITRATE 0.05 MG/ML IJ SOLN
INTRAMUSCULAR | Status: AC
  Filled 2014-01-03: qty 5

## 2014-01-03 MED ORDER — ACETAMINOPHEN 325 MG PO TABS
650.0000 mg | ORAL_TABLET | Freq: Four times a day (QID) | ORAL | Status: DC | PRN
Start: 2014-01-03 — End: 2014-01-06

## 2014-01-03 MED ORDER — METOCLOPRAMIDE HCL 10 MG PO TABS: 5.0000 mg | ORAL_TABLET | Freq: Four times a day (QID) | ORAL | Status: DC | PRN

## 2014-01-03 MED ORDER — AMLODIPINE BESYLATE-VALSARTAN 5-320 MG PO TABS: 1.0000 | ORAL_TABLET | Freq: Every morning | ORAL | Status: DC

## 2014-01-03 MED ORDER — METHOCARBAMOL 100 MG/ML IJ SOLN
500.0000 mg | Freq: Four times a day (QID) | INTRAVENOUS | Status: DC | PRN
  Administered 2014-01-03: 500 mg via INTRAVENOUS
  Filled 2014-01-03: qty 5

## 2014-01-03 MED ORDER — LACTATED RINGERS IV SOLN
INTRAVENOUS | Status: DC
  Administered 2014-01-03: 1000 mL via INTRAVENOUS

## 2014-01-03 MED ORDER — DEXAMETHASONE SODIUM PHOSPHATE 10 MG/ML IJ SOLN
INTRAMUSCULAR | Status: DC | PRN
  Administered 2014-01-03: 10 mg via INTRAVENOUS

## 2014-01-03 MED ORDER — MENTHOL 3 MG MT LOZG
1.0000 | LOZENGE | OROMUCOSAL | Status: DC | PRN
  Filled 2014-01-03: qty 9

## 2014-01-03 MED ORDER — GLYCOPYRROLATE 0.2 MG/ML IJ SOLN
INTRAMUSCULAR | Status: DC | PRN
  Administered 2014-01-03: .8 mg via INTRAVENOUS

## 2014-01-03 MED ORDER — PHENYLEPHRINE 40 MCG/ML (10ML) SYRINGE FOR IV PUSH (FOR BLOOD PRESSURE SUPPORT)
PREFILLED_SYRINGE | INTRAVENOUS | Status: AC
Start: 2014-01-03 — End: 2014-01-03
  Filled 2014-01-03: qty 20

## 2014-01-03 MED ORDER — ONDANSETRON HCL 4 MG/2ML IJ SOLN
INTRAMUSCULAR | Status: DC | PRN
  Administered 2014-01-03: 4 mg via INTRAVENOUS

## 2014-01-03 MED ORDER — LEVOTHYROXINE SODIUM 150 MCG PO TABS
150.0000 ug | ORAL_TABLET | Freq: Every day | ORAL | Status: DC
  Administered 2014-01-04 – 2014-01-06 (×3): 150 ug via ORAL
  Filled 2014-01-03 (×4): qty 1

## 2014-01-03 MED ORDER — AMLODIPINE BESYLATE 5 MG PO TABS
5.0000 mg | ORAL_TABLET | Freq: Once | ORAL | Status: AC
  Administered 2014-01-03: 5 mg via ORAL
  Filled 2014-01-03: qty 1

## 2014-01-03 SURGICAL SUPPLY — 54 items
BAG ZIPLOCK 12X15 (MISCELLANEOUS) IMPLANT
BLADE SAW SAG 73X25 THK (BLADE) ×2
BLADE SAW SGTL 73X25 THK (BLADE) ×1 IMPLANT
CAPT HIP PF COP ×3 IMPLANT
DERMABOND ADVANCED (GAUZE/BANDAGES/DRESSINGS) ×2
DERMABOND ADVANCED .7 DNX12 (GAUZE/BANDAGES/DRESSINGS) ×1 IMPLANT
DRAPE INCISE IOBAN 85X60 (DRAPES) ×3 IMPLANT
DRAPE ORTHO SPLIT 77X108 STRL (DRAPES) ×4
DRAPE POUCH INSTRU U-SHP 10X18 (DRAPES) ×3 IMPLANT
DRAPE SURG 17X11 SM STRL (DRAPES) ×3 IMPLANT
DRAPE SURG ORHT 6 SPLT 77X108 (DRAPES) ×2 IMPLANT
DRAPE U-SHAPE 47X51 STRL (DRAPES) ×3 IMPLANT
DRSG ADAPTIC 3X8 NADH LF (GAUZE/BANDAGES/DRESSINGS) IMPLANT
DRSG AQUACEL AG ADV 3.5X10 (GAUZE/BANDAGES/DRESSINGS) ×3 IMPLANT
DRSG AQUACEL AG ADV 3.5X14 (GAUZE/BANDAGES/DRESSINGS) IMPLANT
DRSG PAD ABDOMINAL 8X10 ST (GAUZE/BANDAGES/DRESSINGS) IMPLANT
DRSG TEGADERM 4X4.75 (GAUZE/BANDAGES/DRESSINGS) IMPLANT
DURAPREP 26ML APPLICATOR (WOUND CARE) ×3 IMPLANT
ELECT BLADE TIP CTD 4 INCH (ELECTRODE) ×3 IMPLANT
ELECT REM PT RETURN 9FT ADLT (ELECTROSURGICAL) ×3
ELECTRODE REM PT RTRN 9FT ADLT (ELECTROSURGICAL) ×1 IMPLANT
EVACUATOR 1/8 PVC DRAIN (DRAIN) IMPLANT
GAUZE SPONGE 2X2 8PLY STRL LF (GAUZE/BANDAGES/DRESSINGS) IMPLANT
GLOVE BIOGEL PI IND STRL 8 (GLOVE) ×2 IMPLANT
GLOVE BIOGEL PI INDICATOR 8 (GLOVE) ×4
GLOVE ECLIPSE 8.0 STRL XLNG CF (GLOVE) ×6 IMPLANT
GLOVE SURG SS PI 6.5 STRL IVOR (GLOVE) ×6 IMPLANT
GOWN STRL REUS W/TWL LRG LVL3 (GOWN DISPOSABLE) ×3 IMPLANT
GOWN STRL REUS W/TWL XL LVL3 (GOWN DISPOSABLE) ×3 IMPLANT
IMMOBILIZER KNEE 20 (SOFTGOODS) ×3 IMPLANT
KIT BASIN OR (CUSTOM PROCEDURE TRAY) ×3 IMPLANT
MANIFOLD NEPTUNE II (INSTRUMENTS) ×3 IMPLANT
NEEDLE HYPO 22GX1.5 SAFETY (NEEDLE) ×3 IMPLANT
PACK TOTAL JOINT (CUSTOM PROCEDURE TRAY) ×3 IMPLANT
POSITIONER SURGICAL ARM (MISCELLANEOUS) ×3 IMPLANT
SPONGE GAUZE 2X2 STER 10/PKG (GAUZE/BANDAGES/DRESSINGS)
SPONGE GAUZE 4X4 12PLY (GAUZE/BANDAGES/DRESSINGS) IMPLANT
SPONGE LAP 18X18 X RAY DECT (DISPOSABLE) IMPLANT
SPONGE LAP 4X18 X RAY DECT (DISPOSABLE) IMPLANT
SPONGE SURGIFOAM ABS GEL 100 (HEMOSTASIS) ×3 IMPLANT
STAPLER VISISTAT 35W (STAPLE) IMPLANT
SUCTION FRAZIER TIP 10 FR DISP (SUCTIONS) ×3 IMPLANT
SUT MNCRL AB 4-0 PS2 18 (SUTURE) ×3 IMPLANT
SUT VIC AB 0 CT1 27 (SUTURE)
SUT VIC AB 0 CT1 27XBRD ANTBC (SUTURE) IMPLANT
SUT VIC AB 1 CT1 27 (SUTURE) ×8
SUT VIC AB 1 CT1 27XBRD ANTBC (SUTURE) ×4 IMPLANT
SUT VIC AB 2-0 CT1 27 (SUTURE) ×6
SUT VIC AB 2-0 CT1 TAPERPNT 27 (SUTURE) ×3 IMPLANT
SUT VLOC 180 0 24IN GS25 (SUTURE) ×3 IMPLANT
SYR 20CC LL (SYRINGE) ×3 IMPLANT
TOWEL OR 17X26 10 PK STRL BLUE (TOWEL DISPOSABLE) ×6 IMPLANT
TRAY FOLEY CATH 14FRSI W/METER (CATHETERS) ×3 IMPLANT
WATER STERILE IRR 1500ML POUR (IV SOLUTION) ×3 IMPLANT

## 2014-01-03 NOTE — Preoperative (Signed)
Beta Blockers   Reason not to administer Beta Blockers:Not Applicable 

## 2014-01-03 NOTE — Care Management Note (Signed)
UR complete    Lanette Ell,MSN,RN [phone] 

## 2014-01-03 NOTE — Anesthesia Postprocedure Evaluation (Signed)
  Anesthesia Post-op Note  Patient: Jean Hamilton  Procedure(s) Performed: Procedure(s) (LRB): RIGHT TOTAL HIP ARTHROPLASTY (Right)  Patient Location: PACU  Anesthesia Type: General  Level of Consciousness: awake and alert   Airway and Oxygen Therapy: Patient Spontanous Breathing  Post-op Pain: mild  Post-op Assessment: Post-op Vital signs reviewed, Patient's Cardiovascular Status Stable, Respiratory Function Stable, Patent Airway and No signs of Nausea or vomiting  Last Vitals:  Filed Vitals:   01/03/14 1412  BP: 145/74  Pulse: 64  Temp: 36.7 C  Resp: 16    Post-op Vital Signs: stable   Complications: No apparent anesthesia complications

## 2014-01-03 NOTE — Progress Notes (Signed)
Notified Dr.Gioffre of stat hemoglobin result: 9.6.

## 2014-01-03 NOTE — Interval H&P Note (Signed)
History and Physical Interval Note:  01/03/2014 7:05 AM  Jean Hamilton  has presented today for surgery, with the diagnosis of OA OF RIGHT HIP  The various methods of treatment have been discussed with the patient and family. After consideration of risks, benefits and other options for treatment, the patient has consented to  Procedure(s): RIGHT TOTAL HIP ARTHROPLASTY (Right) as a surgical intervention .  The patient's history has been reviewed, patient examined, no change in status, stable for surgery.  I have reviewed the patient's chart and labs.  Questions were answered to the patient's satisfaction.     Leora Platt A

## 2014-01-03 NOTE — Brief Op Note (Signed)
01/03/2014  8:59 AM  PATIENT:  [name]  63 y.o. female  PRE-OPERATIVE DIAGNOSIS:  OSTEOARTHRITIS OF RIGHT HIP.Obesity  POST-OPERATIVE DIAGNOSIS:  OSTEOARTHRITIS OF RIGHT HIP.Obesity  PROCEDURE:  Procedure(s): RIGHT TOTAL HIP ARTHROPLASTY (Right)  SURGEON:  Surgeon(s) and Role:    * Tobi Bastos, MD - Primary    * Magnus Sinning, MD - Assisting  PHYSICIAN ASSISTANT:Amber [place] PA   ASSISTANTS:James Aplington MD and Ardeen Jourdain PA   ANESTHESIA:   general  EBL:  Total I/O In: -  Out: 800 [Urine:350; Blood:450]  BLOOD ADMINISTERED:none  DRAINS: none   LOCAL MEDICATIONS USED:  BUPIVICAINE 20cc mixed with 20cc of Normal Saline.  SPECIMEN:  No Specimen  DISPOSITION OF SPECIMEN:  N/A  COUNTS:  YES  TOURNIQUET:  * No tourniquets in log *  DICTATION: .Other Dictation: Dictation Number [id number]  PLAN OF CARE: Admit to inpatient   PATIENT DISPOSITION:  Stable in OR   Delay start of Pharmacological VTE agent (>24hrs) due to surgical blood loss or risk of bleeding: yes

## 2014-01-03 NOTE — Anesthesia Preprocedure Evaluation (Addendum)
Anesthesia Evaluation  Patient identified by MRN, date of birth, ID band Patient awake    Reviewed: Allergy & Precautions, H&P , NPO status , Patient's Chart, lab work & pertinent test results  Airway Mallampati: II TM Distance: >3 FB Neck ROM: Full    Dental no notable dental hx. (+) Partial Upper, Partial Lower   Pulmonary sleep apnea , former smoker,  breath sounds clear to auscultation  Pulmonary exam normal       Cardiovascular hypertension, Pt. on medications Rhythm:Regular Rate:Normal     Neuro/Psych Numbness left index finger negative neurological ROS  negative psych ROS   GI/Hepatic negative GI ROS, Neg liver ROS,   Endo/Other  diabetes, Type 2, Oral Hypoglycemic AgentsHypothyroidism   Renal/GU negative Renal ROS  negative genitourinary   Musculoskeletal negative musculoskeletal ROS (+)   Abdominal   Peds negative pediatric ROS (+)  Hematology negative hematology ROS (+)   Anesthesia Other Findings   Reproductive/Obstetrics negative OB ROS                          Anesthesia Physical Anesthesia Plan  ASA: II  Anesthesia Plan: General   Post-op Pain Management:    Induction: Intravenous  Airway Management Planned: Oral ETT  Additional Equipment:   Intra-op Plan:   Post-operative Plan: Extubation in OR  Informed Consent: I have reviewed the patients History and Physical, chart, labs and discussed the procedure including the risks, benefits and alternatives for the proposed anesthesia with the patient or authorized representative who has indicated his/her understanding and acceptance.   Dental advisory given  Plan Discussed with: CRNA  Anesthesia Plan Comments:         Anesthesia Quick Evaluation

## 2014-01-03 NOTE — Transfer of Care (Signed)
Immediate Anesthesia Transfer of Care Note  Patient: Jean Hamilton  Procedure(s) Performed: Procedure(s) (LRB): RIGHT TOTAL HIP ARTHROPLASTY (Right)  Patient Location: PACU  Anesthesia Type: General  Level of Consciousness: sedated, patient cooperative and responds to stimulation  Airway & Oxygen Therapy: Patient Spontanous Breathing and Patient connected to face mask oxgen  Post-op Assessment: Report given to PACU RN and Post -op Vital signs reviewed and stable  Post vital signs: Reviewed and stable  Complications: No apparent anesthesia complications

## 2014-01-03 NOTE — Op Note (Signed)
NAMETAJUANA, KNISKERN                ACCOUNT NO.:  [id number]  MEDICAL RECORD NO.:  [id number]  LOCATION:  [facility]                         FACILITY:  [facility]  PHYSICIAN:  Kipp Brood. Shalom Ware, M.D.DATE OF BIRTH:  08-23-1951  DATE OF PROCEDURE:  01/03/2014 DATE OF DISCHARGE:                              OPERATIVE REPORT   SURGEON:  Kipp Brood. Gladstone Lighter, M.D.  ASSISTANT:  Tarri Glenn, M.D. and [place], [place].  PREOPERATIVE DIAGNOSES: 1. Severe bone-on-bone osteoarthritis of the right hip. 2. Obesity.  POSTOPERATIVE DIAGNOSES: 1. Severe bone-on-bone osteoarthritis of the right hip. 2. Obesity.  OPERATION:  Right total hip arthroplasty utilizing DePuy system.  I used a size 52 mm cup with a 36 mm inside diameter polyethylene insert, 1 screw size 25 mm in length was used to fix the cup into the acetabulum, as well as we did use a hole eliminator.  The femoral component was a high offset size 5 and the ball was a size 36 mm ceramic ball, ceramic ball on a polyethylene cup.  PROCEDURE IN DETAIL:  Under general anesthesia, routine orthopedic prepping and draping of the right hip was carried out with the patient on left side, right side up.  She had 2 g of IV Ancef.  Appropriate time- out was carried out first.  I also marked the appropriate right leg in the holding area.  Posterolateral approach to hip was carried out. Bleeders were identified and cauterized.  At this time, the self- retaining retractors were inserted.  Incision was carried down to the iliotibial band.  Through the band, I partially then detached the external rotators.  Good control of the bleeding was carried out.  At this time, I did a capsulectomy, dislocated the head, amputated the femoral head at the appropriate level.  I then utilized the box osteotome to clear out the lateral cancellous bone from the trochanter, widening reamer, then the canal finder was inserted.  We thoroughly irrigated out the canal, packed  the canal then, directed attention to the acetabulum.  We completed our capsulectomy.  We did have 1 bleeder inferior to the acetabulum that we cauterized and ligated.  At this time, the bleeding was under control during the entire case.  I then reamed the acetabulum up to 51 for a 52 mm cup.  52 mm cup was inserted with a good angle.  We checked it with the Charnley guide and we had excellent position.  We then affixed the cup with 125 mm length screw and then the hole eliminator was inserted.  I then removed the osteophytes and acetabulum.  I then inserted my permanent, we went through trials after this, after I inserted the permanent polyethylene liner, which is 36 mm inside diameter.  We then had excellent leg lengths, excellent stability.  I then removed the trial of femoral component, irrigated the canal and inserted a high offset +5 femoral stem and then utilized a +5 ball, we initially reduced and we had an excellent fit.  We reduced the hip, made sure no soft tissues in the hip, we made sure there was no further bleeding inferiorly and we irrigated the area out, packed with some thrombin-soaked Gelfoam  and then injected 20 mL mixture of Exparel and normal saline.  The wound then was closed in layers in usual fashion.  She had 2 g of IV Ancef.          ______________________________ Kipp Brood. Gladstone Lighter, M.D.     RAG/MEDQ  D:  01/03/2014  T:  01/03/2014  Job:  [id number]

## 2014-01-04 LAB — CBC
HEMATOCRIT: 26.1 % — AB (ref 36.0–46.0)
HEMOGLOBIN: 8.6 g/dL — AB (ref 12.0–15.0)
MCH: 27.7 pg (ref 26.0–34.0)
MCHC: 33 g/dL (ref 30.0–36.0)
MCV: 84.2 fL (ref 78.0–100.0)
Platelets: 120 10*3/uL — ABNORMAL LOW (ref 150–400)
RBC: 3.1 MIL/uL — ABNORMAL LOW (ref 3.87–5.11)
RDW: 12.6 % (ref 11.5–15.5)
WBC: 10.1 10*3/uL (ref 4.0–10.5)

## 2014-01-04 LAB — BASIC METABOLIC PANEL
BUN: 9 mg/dL (ref 6–23)
CO2: 26 mEq/L (ref 19–32)
Calcium: 9 mg/dL (ref 8.4–10.5)
Chloride: 102 mEq/L (ref 96–112)
Creatinine, Ser: 0.61 mg/dL (ref 0.50–1.10)
GFR calc Af Amer: >90 mL/min (ref >90)
GFR calc non Af Amer: >90 mL/min (ref >90)
GLUCOSE: 131 mg/dL — AB (ref 70–99)
POTASSIUM: 4.6 meq/L (ref 3.7–5.3)
Sodium: 136 mEq/L — ABNORMAL LOW (ref 137–147)

## 2014-01-04 LAB — GLUCOSE, CAPILLARY
Glucose-Capillary: 119 mg/dL — ABNORMAL HIGH (ref 70–99)
Glucose-Capillary: 137 mg/dL — ABNORMAL HIGH (ref 70–99)
Glucose-Capillary: 113 mg/dL — ABNORMAL HIGH (ref 70–99)
Glucose-Capillary: 129 mg/dL — ABNORMAL HIGH (ref 70–99)

## 2014-01-04 LAB — HEMOGLOBIN AND HEMATOCRIT, BLOOD
HCT: 24.8 % — ABNORMAL LOW (ref 36.0–46.0)
Hemoglobin: 8.6 g/dL — ABNORMAL LOW (ref 12.0–15.0)

## 2014-01-04 MED ORDER — RIVAROXABAN 10 MG PO TABS: 10.0000 mg | ORAL_TABLET | Freq: Every day | ORAL | Status: AC

## 2014-01-04 MED ORDER — FERROUS SULFATE 325 (65 FE) MG PO TABS: 325.0000 mg | ORAL_TABLET | Freq: Three times a day (TID) | ORAL | Status: AC

## 2014-01-04 MED ORDER — DOCUSATE SODIUM 100 MG PO CAPS: 100.0000 mg | ORAL_CAPSULE | Freq: Two times a day (BID) | ORAL | Status: DC | PRN

## 2014-01-04 MED ORDER — HYDROCODONE-ACETAMINOPHEN 5-325 MG PO TABS: 1.0000 | ORAL_TABLET | ORAL | Status: AC | PRN

## 2014-01-04 MED ORDER — METHOCARBAMOL 500 MG PO TABS: 500.0000 mg | ORAL_TABLET | Freq: Four times a day (QID) | ORAL | Status: DC | PRN

## 2014-01-04 MED ORDER — LACTATED RINGERS IV SOLN
INTRAVENOUS | Status: DC
  Administered 2014-01-04: 08:00:00 via INTRAVENOUS

## 2014-01-04 NOTE — Progress Notes (Signed)
OT Cancellation Note  Patient Details Name: Jean Hamilton MRN: [id number] DOB: Jul 10, 1951   Cancelled Treatment:    Reason Eval/Treat Not Completed: Other (comment) Spoke with PT regarding current mobility status. Will hold on OT eval today and see pt next date for OT eval.  Jules Schick [phone] 01/04/2014, 12:39 PM

## 2014-01-04 NOTE — Progress Notes (Signed)
Physical Therapy Treatment Patient Details Name: BREALYNN CONTINO MRN: [id number] DOB: 09-29-1951 Today's Date: 01/04/2014    History of Present Illness Posterior  THA on R    PT Comments    Pt progressing  Slowly, not dizzy this session.   Follow Up Recommendations  SNF     Equipment Recommendations  None recommended by PT    Recommendations for Other Services       Precautions / Restrictions Precautions Precautions: Posterior Hip Restrictions Weight Bearing Restrictions: Yes RLE Weight Bearing: Partial weight bearing    Mobility  Bed Mobility Overal bed mobility: Needs Assistance;+ 2 for safety/equipment Bed Mobility: Sit to Supine      Sit to supine: +2 for safety/equipment;Mod assist   General bed mobility comments: cues for technique and posterior precautions, assistance for RLE onto bed.  Transfers Overall transfer level: Needs assistance Equipment used: Rolling walker (2 wheeled) Transfers: Stand Pivot Transfers   Stand pivot transfers: Mod assist       General transfer comment: cues for technique and precautions.transfer from recliner to [facility]  Ambulation/Gait Ambulation/Gait assistance: +2 safety/equipment;Min assist Ambulation Distance (Feet): 8 Feet Assistive device: Rolling walker (2 wheeled) Gait Pattern/deviations: Step-to pattern;Antalgic     General Gait Details: tolerated  upright without dizziness.   Stairs            Wheelchair Mobility    Modified Rankin (Stroke Patients Only)       Balance                                    Cognition Arousal/Alertness: Awake/alert Behavior During Therapy: WFL for tasks assessed/performed Overall Cognitive Status: Within Functional Limits for tasks assessed                      Exercises Total Joint Exercises Ankle Circles/Pumps: AROM;Both;10 reps;Supine Short Arc Quad: AROM;Right;10 reps;Supine Heel Slides: AAROM;Right;10 reps;Supine Hip  ABduction/ADduction: AAROM;Right;10 reps;Supine    General Comments        Pertinent Vitals/Pain Reports pain is less than prior to surgery, very comfortable in bed. Ice applied.    Home Living Family/patient expects to be discharged to:: Skilled nursing facility Living Arrangements: Spouse/significant other                  Prior Function            PT Goals (current goals can now be found in the care plan section) Acute Rehab PT Goals Patient Stated Goal: I want to get up and walk. PT Goal Formulation: With patient Time For Goal Achievement: 01/11/14 Potential to Achieve Goals: Good Progress towards PT goals: Progressing toward goals    Frequency  7X/week    PT Plan Current plan remains appropriate    End of Session   Activity Tolerance: Patient tolerated treatment well Patient left: in bed;with call bell/phone within reach;with family/visitor present     Time: 1327-1350 PT Time Calculation (min): 23 min  Charges:  $Gait Training: 8-22 mins                    G Codes:      Claretha Cooper 01/04/2014, 4:02 PM

## 2014-01-04 NOTE — Evaluation (Signed)
Physical Therapy Evaluation Patient Details Name: ISZABELLA HEBENSTREIT MRN: [id number] DOB: [birth year] Today's Date: 01/04/2014   History of Present Illness  Posterior  THA R  Clinical Impression  Pt became dizzy and pale after amb. X 8 ' . Pt will benefit from PT to address problems listed.     Follow Up Recommendations SNF    Equipment Recommendations  None recommended by PT    Recommendations for Other Services       Precautions / Restrictions Precautions Precautions: Posterior Hip Restrictions Weight Bearing Restrictions: Yes RLE Weight Bearing: Partial weight bearing      Mobility  Bed Mobility Overal bed mobility: Needs Assistance;+2 for physical assistance;+ 2 for safety/equipment Bed Mobility: Supine to Sit     Supine to sit: +2 for physical assistance;+2 for safety/equipment;HOB elevated     General bed mobility comments: cues for technique and posterior precautions  Transfers Overall transfer level: Needs assistance Equipment used: Rolling walker (2 wheeled)             General transfer comment: cues for technique and precautions.  Ambulation/Gait Ambulation/Gait assistance: +2 physical assistance;+2 safety/equipment Ambulation Distance (Feet): 8 Feet Assistive device: Rolling walker (2 wheeled) Gait Pattern/deviations: Step-to pattern;Antalgic     General Gait Details: became dizzy and chair pulled up.BP 130/65, HR 76  Stairs            Wheelchair Mobility    Modified Rankin (Stroke Patients Only)       Balance                                     Pertinent Vitals/Pain R hip 5 , ice applied.    Home Living Family/patient expects to be discharged to:: Skilled nursing facility Living Arrangements: Spouse/significant other                    Prior Function                 Hand Dominance        Extremity/Trunk Assessment   Upper Extremity Assessment: Overall WFL for tasks assessed            Lower Extremity Assessment: RLE deficits/detail RLE Deficits / Details: requires assistance to move  RLE to edle, decreased knee flexion after KI removed       Communication      Cognition Arousal/Alertness: Awake/alert Behavior During Therapy: WFL for tasks assessed/performed Overall Cognitive Status: Within Functional Limits for tasks assessed                      General Comments      Exercises        Assessment/Plan    PT Assessment Patient needs continued PT services  PT Diagnosis Difficulty walking;Acute pain   PT Problem List Decreased strength;Decreased range of motion;Decreased activity tolerance;Decreased mobility;Decreased knowledge of precautions;Decreased safety awareness;Decreased knowledge of use of DME;Pain;Cardiopulmonary status limiting activity  PT Treatment Interventions DME instruction;Gait training;Functional mobility training;Therapeutic activities;Therapeutic exercise;Patient/family education   PT Goals (Current goals can be found in the Care Plan section) Acute Rehab PT Goals Patient Stated Goal: I want to get up and walk. PT Goal Formulation: With patient Time For Goal Achievement: 01/11/14 Potential to Achieve Goals: Good    Frequency 7X/week   Barriers to discharge Decreased caregiver support      End of Session   Activity Tolerance: Treatment  limited secondary to medical complications (Comment) (dizziness) Patient left: in chair;with call bell/phone within reach         Time: 1129-1144 PT Time Calculation (min): 15 min   Charges:   PT Evaluation $Initial PT Evaluation Tier I: 1 Procedure PT Treatments $Gait Training: 8-22 mins   PT G Codes:          Claretha Cooper 01/04/2014, 1:06 PM

## 2014-01-04 NOTE — Progress Notes (Signed)
Clinical Social Work Department CLINICAL SOCIAL WORK PLACEMENT NOTE 01/04/2014  Patient:  Jean Hamilton, Jean Hamilton  Account Number:  [id number] Admit date:  01/03/2014  Clinical Social Worker:  Werner Lean, LCSW  Date/time:  01/04/2014 01:20 PM  Clinical Social Work is seeking post-discharge placement for this patient at the following level of care:   SKILLED NURSING   (*CSW will update this form in Epic as items are completed)     Patient/family provided with [facility] Department of Clinical Social Work's list of facilities offering this level of care within the geographic area requested by the patient (or if unable, by the patient's family).  01/04/2014  Patient/family informed of their freedom to choose among providers that offer the needed level of care, that participate in Medicare, Medicaid or managed care program needed by the patient, have an available bed and are willing to accept the patient.    Patient/family informed of [facility]' ownership interest in [facility], as well as of the fact that they are under no obligation to receive care at this facility.  PASARR submitted to EDS on 01/04/2014 PASARR number received from EDS on 01/04/2014  FL2 transmitted to all facilities in geographic area requested by pt/family on  01/04/2014 FL2 transmitted to all facilities within larger geographic area on   Patient informed that his/her managed care company has contracts with or will negotiate with  certain facilities, including the following:     Patient/family informed of bed offers received:  01/04/2014 Patient chooses bed at [facility] Physician recommends and patient chooses bed at    Patient to be transferred to [facility] on   Patient to be transferred to facility by   The following physician request were entered in Epic:   Additional Comments:  Werner Lean LCSW [phone]

## 2014-01-04 NOTE — Progress Notes (Signed)
Clinical Social Work Department BRIEF PSYCHOSOCIAL ASSESSMENT 01/04/2014  Patient:  Jean Hamilton, Jean Hamilton     Account Number:  [id number]     Admit date:  01/03/2014  Clinical Social Worker:  Lacie Scotts  Date/Time:  01/04/2014 12:53 PM  Referred by:  Physician  Date Referred:  01/04/2014 Referred for  SNF Placement   Other Referral:   Interview type:  Patient Other interview type:    PSYCHOSOCIAL DATA Living Status:  HUSBAND Admitted from facility:   Level of care:   Primary support name:  Greta Doom Primary support relationship to patient:  SPOUSE Degree of support available:   unclear    CURRENT CONCERNS Current Concerns  Post-Acute Placement   Other Concerns:    SOCIAL WORK ASSESSMENT / PLAN Pt is a 63 yr old female living at home prior to hospitalization. CSW met with pt to assist with d/c planning. Pt has made prior arrangements to have [facility] Rehab at [facility] following hospital d/c. CSW has contacted SNF and d/c plans have been confirmed pending [employer] authorization.   Assessment/plan status:  Psychosocial Support/Ongoing Assessment of Needs Other assessment/ plan:   Information/referral to community resources:   Insurance coverage for SNF placement reviewed.    PATIENT'S/FAMILY'S RESPONSE TO PLAN OF CARE: Pt had a good night. Her pain is under control. Pt is motivated to begin rehab. She is looking forward to rehab at [facility].   Werner Lean LCSW [phone]

## 2014-01-04 NOTE — Discharge Instructions (Signed)
Walk with your walker. Partial weightbearing 50% right LE for one week then progress to WBAT Do not change your dressing unless there is excess drainage. Shower only, no tub bath. Call if any temperatures greater than 101 or any wound complications: [phone] during the day and ask for Dr. Charlestine Night nurse, Brunilda Payor.     Information on my medicine - XARELTO (Rivaroxaban)  This medication education was reviewed with me or my healthcare representative as part of my discharge preparation.  The pharmacist that spoke with me during my hospital stay was:  [place], Gaye Alken, [facility]  Why was Xarelto prescribed for you? Xarelto was prescribed for you to reduce the risk of blood clots forming after orthopedic surgery. The medical term for these abnormal blood clots is venous thromboembolism (VTE).  What do you need to know about xarelto ? Take your Xarelto ONCE DAILY at the same time every day. You may take it either with or without food.  If you have difficulty swallowing the tablet whole, you may crush it and mix in applesauce just prior to taking your dose.  Take Xarelto exactly as prescribed by your doctor and DO NOT stop taking Xarelto without talking to the doctor who prescribed the medication.  Stopping without other VTE prevention medication to take the place of Xarelto may increase your risk of developing a clot.  After discharge, you should have regular check-up appointments with your healthcare provider that is prescribing your Xarelto.    What do you do if you miss a dose? If you miss a dose, take it as soon as you remember on the same day then continue your regularly scheduled once daily regimen the next day. Do not take two doses of Xarelto on the same day.   Important Safety Information A possible side effect of Xarelto is bleeding. You should call your healthcare provider right away if you experience any of the following:   Bleeding from an injury or your nose  that does not stop.   Unusual colored urine (red or dark brown) or unusual colored stools (red or black).   Unusual bruising for unknown reasons.   A serious fall or if you hit your head (even if there is no bleeding).  Some medicines may interact with Xarelto and might increase your risk of bleeding while on Xarelto. To help avoid this, consult your healthcare provider or pharmacist prior to using any new prescription or non-prescription medications, including herbals, vitamins, non-steroidal anti-inflammatory drugs (NSAIDs) and supplements.  This website has more information on Xarelto: [email or web address]

## 2014-01-04 NOTE — Progress Notes (Signed)
Subjective: 1 Day Post-Op Procedure(s) (LRB): RIGHT TOTAL HIP ARTHROPLASTY (Right) Patient reports pain as 3 on 0-10 scale.  Doing well today except for some dizziness when she got up. Good dorsiflexion in foot.  Objective: Vital signs in last 24 hours: Temp:  [97.6 F (36.4 C)-99.3 F (37.4 C)] 98.2 F (36.8 C) (03/26 0537) Pulse Rate:  [55-74] 69 (03/26 0537) Resp:  [11-16] 16 (03/26 0537) BP: (120-176)/(57-91) 137/61 mmHg (03/26 0537) SpO2:  [100 %] 100 % (03/26 0537)  Intake/Output from previous day: 03/25 0701 - 03/26 0700 In: 4918 [P.O.:840; I.V.:4078] Out: 3700 [Urine:3250; Blood:450] Intake/Output this shift:     Recent Labs  01/03/14 1846 01/04/14 0418  HGB 9.6* 8.6*    Recent Labs  01/03/14 1846 01/04/14 0418  WBC  --  10.1  RBC  --  3.10*  HCT 28.2* 26.1*  PLT  --  120*    Recent Labs  01/04/14 0418  NA 136*  K 4.6  CL 102  CO2 26  BUN 9  CREATININE 0.61  GLUCOSE 131*  CALCIUM 9.0   No results found for this basename: LABPT, INR,  in the last 72 hours  Dorsiflexion/Plantar flexion intact  Assessment/Plan: 1 Day Post-Op Procedure(s) (LRB): RIGHT TOTAL HIP ARTHROPLASTY (Right) Up with therapy. Following Hbg closely. Hbg this morning is 8.6.Will repeat this afternoon. And again in A.M.  Deepak Bless A 01/04/2014, 7:18 AM

## 2014-01-05 DIAGNOSIS — D62 Acute posthemorrhagic anemia: Secondary | ICD-10-CM

## 2014-01-05 LAB — BASIC METABOLIC PANEL
BUN: 12 mg/dL (ref 6–23)
CALCIUM: 8.9 mg/dL (ref 8.4–10.5)
CO2: 29 mEq/L (ref 19–32)
Chloride: 105 mEq/L (ref 96–112)
Creatinine, Ser: 0.65 mg/dL (ref 0.50–1.10)
GFR calc Af Amer: >90 mL/min (ref >90)
GFR calc non Af Amer: >90 mL/min (ref >90)
GLUCOSE: 148 mg/dL — AB (ref 70–99)
Potassium: 5 mEq/L (ref 3.7–5.3)
Sodium: 141 mEq/L (ref 137–147)

## 2014-01-05 LAB — CBC
HCT: 21.9 % — ABNORMAL LOW (ref 36.0–46.0)
Hemoglobin: 7.7 g/dL — ABNORMAL LOW (ref 12.0–15.0)
MCH: 29.2 pg (ref 26.0–34.0)
MCHC: 35.2 g/dL (ref 30.0–36.0)
MCV: 83 fL (ref 78.0–100.0)
PLATELETS: 108 10*3/uL — AB (ref 150–400)
RBC: 2.64 MIL/uL — ABNORMAL LOW (ref 3.87–5.11)
RDW: 12.7 % (ref 11.5–15.5)
WBC: 12.4 10*3/uL — ABNORMAL HIGH (ref 4.0–10.5)

## 2014-01-05 LAB — HEMOGLOBIN AND HEMATOCRIT, BLOOD
HEMATOCRIT: 22 % — AB (ref 36.0–46.0)
Hemoglobin: 7.5 g/dL — ABNORMAL LOW (ref 12.0–15.0)

## 2014-01-05 LAB — GLUCOSE, CAPILLARY
Glucose-Capillary: 151 mg/dL — ABNORMAL HIGH (ref 70–99)
Glucose-Capillary: 125 mg/dL — ABNORMAL HIGH (ref 70–99)

## 2014-01-05 LAB — PREPARE RBC (CROSSMATCH)

## 2014-01-05 NOTE — Progress Notes (Signed)
Physical Therapy Treatment Patient Details Name: Jean Hamilton MRN: [id number] DOB: 11/26/1950 Today's Date: 2014/01/25    History of Present Illness Posterior  THA on R    PT Comments    Pt continues motivated and cooperative but fatigues easily.  Pt denies dizziness with activity.   Follow Up Recommendations  SNF     Equipment Recommendations  None recommended by PT    Recommendations for Other Services       Precautions / Restrictions Precautions Precautions: Posterior Hip Precaution Comments: Pt recalls 2/3 THP without cues: all post THP reviewed Restrictions Weight Bearing Restrictions: Yes RLE Weight Bearing: Partial weight bearing RLE Partial Weight Bearing Percentage or Pounds: 25-50%    Mobility  Bed Mobility Overal bed mobility: Needs Assistance Bed Mobility: Sit to Supine       Sit to supine: Mod assist   General bed mobility comments: used bed rails; cues for sequence and use of L LE to self assist  Transfers Overall transfer level: Needs assistance Equipment used: Rolling walker (2 wheeled) Transfers: Sit to/from Stand Sit to Stand: Mod assist         General transfer comment: cues for UE/LE placement  Ambulation/Gait Ambulation/Gait assistance: Mod assist Ambulation Distance (Feet): 20 Feet (and 10' and 4') Assistive device: Rolling walker (2 wheeled) Gait Pattern/deviations: Step-to pattern;Decreased step length - right;Decreased step length - left;Shuffle;Trunk flexed Gait velocity: decr   General Gait Details: Cues for sequence, stride length, posture, position from RW and ER on L   Stairs            Wheelchair Mobility    Modified Rankin (Stroke Patients Only)       Balance                                    Cognition Arousal/Alertness: Awake/alert Behavior During Therapy: WFL for tasks assessed/performed Overall Cognitive Status: Within Functional Limits for tasks assessed                       Exercises      General Comments        Pertinent Vitals/Pain     Home Living                      Prior Function            PT Goals (current goals can now be found in the care plan section) Acute Rehab PT Goals Patient Stated Goal: Resume previous lifestyle with decreased pain PT Goal Formulation: With patient Time For Goal Achievement: 01/11/14 Potential to Achieve Goals: Good Progress towards PT goals: Progressing toward goals    Frequency  7X/week    PT Plan Current plan remains appropriate    End of Session Equipment Utilized During Treatment: Gait belt Activity Tolerance: Patient tolerated treatment well;Patient limited by fatigue Patient left: in bed;with call bell/phone within reach;with family/visitor present     Time: 8676-1950 PT Time Calculation (min): 31 min  Charges:  $Gait Training: 23-37 mins                    G Codes:      Jean Hamilton 01-25-2014, 5:02 PM

## 2014-01-05 NOTE — Evaluation (Signed)
Occupational Therapy Evaluation Patient Details Name: Jean Hamilton MRN: [id number] DOB: [birth year] Today's Date: 01/05/2014    History of Present Illness     Clinical Impression   Pt was admitted for R THA, posterior approach. She will benefit from skilled OT to increase safety and independence following THPs.  Goals in acute are for supervision to min A level.  She will benefit from continued OT at [facility].    Follow Up Recommendations  SNF    Equipment Recommendations  3 in 1 bedside comode    Recommendations for Other Services       Precautions / Restrictions Precautions Precautions: Posterior Hip Restrictions Weight Bearing Restrictions: Yes RLE Weight Bearing: Partial weight bearing      Mobility Bed Mobility         Supine to sit: Mod assist;HOB elevated     General bed mobility comments: used bed rails  Transfers   Equipment used: Rolling walker (2 wheeled) Transfers: Sit to/from Stand Sit to Stand: Mod assist         General transfer comment: cues for UE/LE placement    Balance                                    ADL         Lower Body Bathing: Sit to/from stand;With adaptive equipment;Minimal assistance (mod for sit to stand) Lower Body Dressing: Maximal assistance;Adhering to hip precautions;Sit to/from stand;With adaptive equipment         General ADL Comments: Mod A for sit to stand for adls.  Sidestepped up [place].  Pt performed adl from eob; used reacher and sock aide.  Pt has difficulty lifting RLE.  Verbalizes understanding of THPs.  Pt can perform UB adls with set up     Vision                     Perception     Praxis      Pertinent Vitals/Pain Sore R hip; repositioned and ice applied     Hand Dominance     Extremity/Trunk Assessment Upper Extremity Assessment Upper Extremity Assessment: Overall WFL for tasks assessed           Communication Communication Communication: No difficulties    Cognition Arousal/Alertness: Awake/alert Behavior During Therapy: WFL for tasks assessed/performed Overall Cognitive Status: Within Functional Limits for tasks assessed                     General Comments       Exercises      Home Living Family/patient expects to be discharged to:: Skilled nursing facility                                        Prior Functioning/Environment Level of Independence: Independent with assistive device(s)             OT Diagnosis:     OT Problem List: Decreased strength;Decreased activity tolerance;Decreased knowledge of use of DME or AE;Pain;Decreased knowledge of precautions   OT Treatment/Interventions: Self-care/ADL training;DME and/or AE instruction;Patient/family education    OT Goals(Current goals can be found in the care plan section) Acute Rehab OT Goals Patient Stated Goal: get as independent as possible; wash up today OT Goal Formulation: With patient Time For Goal Achievement: 01/12/14 Potential  to Achieve Goals: Good ADL Goals Pt Will Perform Grooming: with supervision;standing Pt Will Perform Lower Body Bathing: with supervision;with adaptive equipment;sit to/from stand Pt Will Perform Lower Body Dressing: with min assist;with adaptive equipment;sit to/from stand Pt Will Transfer to Toilet: with min guard assist;bedside commode;ambulating Pt Will Perform Toileting - Clothing Manipulation and hygiene: with supervision;sit to/from stand  OT Frequency: Min 2X/week   Barriers to D/C:            End of Session:    Activity Tolerance: Patient tolerated treatment well Patient left: in bed;with call bell/phone within reach (pt getting ready to move to another unit)   Time: 2336-1224 OT Time Calculation (min): 32 min Charges:  OT General Charges $OT Visit: 1 Procedure OT Evaluation $Initial OT Evaluation Tier I: 1 Procedure OT Treatments $Self Care/Home Management : 23-37 mins G-Codes:     Kynlei Piontek 01-11-14, 9:40 AM  Lesle Chris, OTR/L [phone] Jan 11, 2014

## 2014-01-05 NOTE — Progress Notes (Signed)
Subjective: 2 Days Post-Op Procedure(s) (LRB): RIGHT TOTAL HIP ARTHROPLASTY (Right) Patient reports pain as 3 on 0-10 scale.Up in Chair and Hbg is decreasing. She has some dizziness probably secondary to Meds. Vitals are stable. Will wait on Blood Transfusion and check Hbg at 3:00 and nurse will call me  Objective: Vital signs in last 24 hours: Temp:  [98.8 F (37.1 C)-99.4 F (37.4 C)] 99.4 F (37.4 C) (03/27 0554) Pulse Rate:  [60-84] 84 (03/27 0554) Resp:  [16-17] 16 (03/27 0554) BP: (122-136)/(63-65) 136/63 mmHg (03/27 0554) SpO2:  [96 %-99 %] 97 % (03/27 0554)  Intake/Output from previous day: 03/26 0701 - 03/27 0700 In: 1490.2 [P.O.:600; I.V.:890.2] Out: 1800 [Urine:1800] Intake/Output this shift:     Recent Labs  01/03/14 1846 01/04/14 0418 01/04/14 1434 01/05/14 0358  HGB 9.6* 8.6* 8.6* 7.7*    Recent Labs  01/04/14 0418 01/04/14 1434 01/05/14 0358  WBC 10.1  --  12.4*  RBC 3.10*  --  2.64*  HCT 26.1* 24.8* 21.9*  PLT 120*  --  108*    Recent Labs  01/04/14 0418 01/05/14 0358  NA 136* 141  K 4.6 5.0  CL 102 105  CO2 26 29  BUN 9 12  CREATININE 0.61 0.65  GLUCOSE 131* 148*  CALCIUM 9.0 8.9   No results found for this basename: LABPT, INR,  in the last 72 hours  Dorsiflexion/Plantar flexion intact  Assessment/Plan: 2 Days Post-Op Procedure(s) (LRB): RIGHT TOTAL HIP ARTHROPLASTY (Right) Up with therapy  Jean Hamilton A 01/05/2014, 7:16 AM

## 2014-01-05 NOTE — Progress Notes (Signed)
CSW assisting with d/c planning. [employer] has authorized ST rehab at [facility]. D/C Summary has been to SNF for SAT admission if pt is stable for d/c. Weekend CSW will assist with d/c planning to SNF.  Werner Lean LCSW [phone]

## 2014-01-05 NOTE — Progress Notes (Signed)
Order to transfuse held by MD due to patient's hesitancy and lack of symptoms. Will recheck hemoglobin at 1500.

## 2014-01-05 NOTE — Discharge Summary (Addendum)
Physician Discharge Summary   Patient ID: Jean Hamilton MRN: [id number] DOB/AGE: 1951/06/27 63 y.o.  Admit date: 01/03/2014 Discharge date: 01/06/2014  Primary Diagnosis: Right hip pain; Osteoarthritis, right hip  Admission Diagnoses:  Past Medical History  Diagnosis Date  . Hypertension   . Sleep apnea     no cpap use since weight loss about 10 yrs ago  . Hypothyroidism   . Diabetes mellitus without complication   . GERD (gastroesophageal reflux disease)   . Arthritis     oa  . Numbness last few weeks    left index finger   Discharge Diagnoses:   Active Problems:   Osteoarthritis of right hip   History of total right hip arthroplasty  Estimated body mass index is 31.54 kg/(m^2) as calculated from the following:   Height as of this encounter: _0  (1.778 m).   Weight as of this encounter: 99.7 kg (219 lb 12.8 oz).  Procedure(s) (LRB): RIGHT TOTAL HIP ARTHROPLASTY (Right)   Consults: None  HPI: Jean Hamilton, 63 y.o. female, has a history of pain and functional disability in the right hip(s) due to arthritis and patient has failed non-surgical conservative treatments for greater than 12 weeks to include NSAID's and/or analgesics, corticosteriod injections, use of assistive devices and activity modification. Onset of symptoms was gradual starting 4 years ago with gradually worsening course since that time.The patient noted no past surgery on the right hip(s). Patient currently rates pain in the right hip at 8 out of 10 with activity. Patient has night pain, worsening of pain with activity and weight bearing, pain that interfers with activities of daily living, pain with passive range of motion and crepitus. Patient has evidence of periarticular osteophytes and joint space narrowing by imaging studies. This condition presents safety issues increasing the risk of falls. There is no current active infection.     Laboratory Data: Admission on 01/03/2014  Component Date Value  Ref Range Status  . ABO/RH(D) 01/03/2014 B POS   Final  . Antibody Screen 01/03/2014 NEG   Final  . Sample Expiration 01/03/2014 01/06/2014   Final  . Glucose-Capillary 01/03/2014 106* 70 - 99 mg/dL Final  . ABO/RH(D) 01/03/2014 B POS   Final  . Glucose-Capillary 01/03/2014 115* 70 - 99 mg/dL Final  . Comment 1 01/03/2014 Documented in Chart   Final  . Comment 2 01/03/2014 Notify RN   Final  . Glucose-Capillary 01/03/2014 162* 70 - 99 mg/dL Final  . Glucose-Capillary 01/03/2014 186* 70 - 99 mg/dL Final  . WBC 01/04/2014 10.1  4.0 - 10.5 K/uL Final  . RBC 01/04/2014 3.10* 3.87 - 5.11 MIL/uL Final  . Hemoglobin 01/04/2014 8.6* 12.0 - 15.0 g/dL Final  . HCT 01/04/2014 26.1* 36.0 - 46.0 % Final  . MCV 01/04/2014 84.2  78.0 - 100.0 fL Final  . MCH 01/04/2014 27.7  26.0 - 34.0 pg Final  . MCHC 01/04/2014 33.0  30.0 - 36.0 g/dL Final  . RDW 01/04/2014 12.6  11.5 - 15.5 % Final  . Platelets 01/04/2014 120* 150 - 400 K/uL Final  . Sodium 01/04/2014 136* 137 - 147 mEq/L Final  . Potassium 01/04/2014 4.6  3.7 - 5.3 mEq/L Final  . Chloride 01/04/2014 102  96 - 112 mEq/L Final  . CO2 01/04/2014 26  19 - 32 mEq/L Final  . Glucose, Bld 01/04/2014 131* 70 - 99 mg/dL Final  . BUN 01/04/2014 9  6 - 23 mg/dL Final  . Creatinine, Ser 01/04/2014 0.61  0.50 - 1.10 mg/dL Final  . Calcium 01/04/2014 9.0  8.4 - 10.5 mg/dL Final  . GFR calc non Af Amer 01/04/2014 >90  >90 mL/min Final  . GFR calc Af Amer 01/04/2014 >90  >90 mL/min Final   Comment: (NOTE)                          The eGFR has been calculated using the CKD EPI equation.                          This calculation has not been validated in all clinical situations.                          eGFR's persistently <90 mL/min signify possible Chronic Kidney                          Disease.  Marland Kitchen Hemoglobin 01/03/2014 9.6* 12.0 - 15.0 g/dL Final  . HCT 01/03/2014 28.2* 36.0 - 46.0 % Final  . Glucose-Capillary 01/03/2014 153* 70 - 99 mg/dL Final  .  Comment 1 01/03/2014 Notify RN   Final  . Hemoglobin 01/04/2014 8.6* 12.0 - 15.0 g/dL Final  . HCT 01/04/2014 24.8* 36.0 - 46.0 % Final  . Glucose-Capillary 01/04/2014 119* 70 - 99 mg/dL Final  . Glucose-Capillary 01/04/2014 137* 70 - 99 mg/dL Final  . WBC 01/05/2014 12.4* 4.0 - 10.5 K/uL Final  . RBC 01/05/2014 2.64* 3.87 - 5.11 MIL/uL Final  . Hemoglobin 01/05/2014 7.7* 12.0 - 15.0 g/dL Final  . HCT 01/05/2014 21.9* 36.0 - 46.0 % Final  . MCV 01/05/2014 83.0  78.0 - 100.0 fL Final  . MCH 01/05/2014 29.2  26.0 - 34.0 pg Final  . MCHC 01/05/2014 35.2  30.0 - 36.0 g/dL Final  . RDW 01/05/2014 12.7  11.5 - 15.5 % Final  . Platelets 01/05/2014 108* 150 - 400 K/uL Final   Comment: REPEATED TO VERIFY                          PLATELET COUNT CONFIRMED BY SMEAR                          SPECIMEN CHECKED FOR CLOTS  . Sodium 01/05/2014 141  137 - 147 mEq/L Final  . Potassium 01/05/2014 5.0  3.7 - 5.3 mEq/L Final  . Chloride 01/05/2014 105  96 - 112 mEq/L Final  . CO2 01/05/2014 29  19 - 32 mEq/L Final  . Glucose, Bld 01/05/2014 148* 70 - 99 mg/dL Final  . BUN 01/05/2014 12  6 - 23 mg/dL Final  . Creatinine, Ser 01/05/2014 0.65  0.50 - 1.10 mg/dL Final  . Calcium 01/05/2014 8.9  8.4 - 10.5 mg/dL Final  . GFR calc non Af Amer 01/05/2014 >90  >90 mL/min Final  . GFR calc Af Amer 01/05/2014 >90  >90 mL/min Final   Comment: (NOTE)                          The eGFR has been calculated using the CKD EPI equation.                          This calculation has not been validated in all  clinical situations.                          eGFR's persistently <90 mL/min signify possible Chronic Kidney                          Disease.  . Glucose-Capillary 01/04/2014 113* 70 - 99 mg/dL Final  . Comment 1 01/04/2014 Notify RN   Final  . Comment 2 01/04/2014 Documented in Chart   Final  . Glucose-Capillary 01/04/2014 129* 70 - 99 mg/dL Final  . Comment 1 01/04/2014 Notify RN   Final  Hospital Outpatient Visit  on 12/28/2013  Component Date Value Ref Range Status  . aPTT 12/28/2013 31  24 - 37 seconds Final  . Sodium 12/28/2013 139  137 - 147 mEq/L Final  . Potassium 12/28/2013 4.3  3.7 - 5.3 mEq/L Final  . Chloride 12/28/2013 101  96 - 112 mEq/L Final  . CO2 12/28/2013 24  19 - 32 mEq/L Final  . Glucose, Bld 12/28/2013 106* 70 - 99 mg/dL Final  . BUN 12/28/2013 10  6 - 23 mg/dL Final  . Creatinine, Ser 12/28/2013 0.64  0.50 - 1.10 mg/dL Final  . Calcium 12/28/2013 10.1  8.4 - 10.5 mg/dL Final  . Total Protein 12/28/2013 8.4* 6.0 - 8.3 g/dL Final  . Albumin 12/28/2013 4.1  3.5 - 5.2 g/dL Final  . AST 12/28/2013 27  0 - 37 U/L Final  . ALT 12/28/2013 27  0 - 35 U/L Final  . Alkaline Phosphatase 12/28/2013 69  39 - 117 U/L Final  . Total Bilirubin 12/28/2013 0.3  0.3 - 1.2 mg/dL Final  . GFR calc non Af Amer 12/28/2013 >90  >90 mL/min Final  . GFR calc Af Amer 12/28/2013 >90  >90 mL/min Final   Comment: (NOTE)                          The eGFR has been calculated using the CKD EPI equation.                          This calculation has not been validated in all clinical situations.                          eGFR's persistently <90 mL/min signify possible Chronic Kidney                          Disease.  Marland Kitchen Prothrombin Time 12/28/2013 13.3  11.6 - 15.2 seconds Final  . INR 12/28/2013 1.03  0.00 - 1.49 Final  . Color, Urine 12/28/2013 YELLOW  YELLOW Final  . APPearance 12/28/2013 CLEAR  CLEAR Final  . Specific Gravity, Urine 12/28/2013 1.013  1.005 - 1.030 Final  . pH 12/28/2013 5.5  5.0 - 8.0 Final  . Glucose, UA 12/28/2013 NEGATIVE  NEGATIVE mg/dL Final  . Hgb urine dipstick 12/28/2013 NEGATIVE  NEGATIVE Final  . Bilirubin Urine 12/28/2013 NEGATIVE  NEGATIVE Final  . Ketones, ur 12/28/2013 NEGATIVE  NEGATIVE mg/dL Final  . Protein, ur 12/28/2013 NEGATIVE  NEGATIVE mg/dL Final  . Urobilinogen, UA 12/28/2013 0.2  0.0 - 1.0 mg/dL Final  . Nitrite 12/28/2013 NEGATIVE  NEGATIVE Final  .  Leukocytes, UA 12/28/2013 NEGATIVE  NEGATIVE Final   MICROSCOPIC NOT DONE ON URINES WITH  NEGATIVE PROTEIN, BLOOD, LEUKOCYTES, NITRITE, OR GLUCOSE <1000 mg/dL.  . WBC 12/28/2013 6.2  4.0 - 10.5 K/uL Final  . RBC 12/28/2013 4.26  3.87 - 5.11 MIL/uL Final  . Hemoglobin 12/28/2013 12.3  12.0 - 15.0 g/dL Final  . HCT 12/28/2013 35.8* 36.0 - 46.0 % Final  . MCV 12/28/2013 84.0  78.0 - 100.0 fL Final  . MCH 12/28/2013 28.9  26.0 - 34.0 pg Final  . MCHC 12/28/2013 34.4  30.0 - 36.0 g/dL Final  . RDW 12/28/2013 12.7  11.5 - 15.5 % Final  . Platelets 12/28/2013 167  150 - 400 K/uL Final  . MRSA, PCR 12/28/2013 NEGATIVE  NEGATIVE Final  . Staphylococcus aureus 12/28/2013 NEGATIVE  NEGATIVE Final   Comment:                                 The Xpert SA Assay (FDA                          approved for NASAL specimens                          in patients over 69 years of age),                          is one component of                          a comprehensive surveillance                          program.  Test performance has                          been validated by [employer] for patients greater                          than or equal to 21 year old.                          It is not intended                          to diagnose infection nor to                          guide or monitor treatment.     X-Rays:Dg Chest 2 View  12/28/2013   CLINICAL DATA:  Preoperative for right total hip joint replacement. History of hypertension.  EXAM: CHEST  2 VIEW  COMPARISON:  DG CHEST 2V dated 08/05/2012  FINDINGS: The lungs are adequately inflated. There is no focal infiltrate. There is no pleural effusion or pneumothorax. The cardiac silhouette is top normal in size but stable. The pulmonary vascularity is not engorged. The mediastinum is normal in width. The observed portions of the bony thorax appear normal.  IMPRESSION: There is no evidence of active cardiopulmonary disease.    Electronically Signed   By: Shanon Brow  [place]   On: 12/28/2013 15:54   Dg Hip Portable 1 View Right  01/03/2014   CLINICAL DATA:  Postop  EXAM: PORTABLE RIGHT HIP - 1 VIEW  COMPARISON:  None.  FINDINGS: Total right hip arthroplasty. The femoral component and acetabular component appear well seated.  IMPRESSION: No complication following right hip arthroplasty   Electronically Signed   By: Suzy Bouchard M.D.   On: 01/03/2014 10:48    EKG: Orders placed during the hospital encounter of 12/28/13  . EKG 12-LEAD  . EKG 12-LEAD     Hospital Course: Patient was admitted to [facility] and taken to the OR and underwent the above state procedure without complications.  Patient tolerated the procedure well and was later transferred to the recovery room and then to the orthopaedic floor for postoperative care.  They were given PO and IV analgesics for pain control following their surgery.  They were given 24 hours of postoperative antibiotics of  Anti-infectives   Start     Dose/Rate Route Frequency Ordered Stop   01/03/14 1400  ceFAZolin (ANCEF) IVPB 1 g/50 mL premix     1 g 100 mL/hr over 30 Minutes Intravenous Every 6 hours 01/03/14 1133 01/03/14 2034   01/03/14 0804  polymyxin B 500,000 Units, bacitracin 50,000 Units in sodium chloride irrigation 0.9 % 500 mL irrigation  Status:  Discontinued       As needed 01/03/14 0804 01/03/14 0931   01/03/14 0507  ceFAZolin (ANCEF) IVPB 2 g/50 mL premix     2 g 100 mL/hr over 30 Minutes Intravenous On call to O.R. 01/03/14 0507 01/03/14 0735     and started on DVT prophylaxis in the form of Xarelto.   PT and OT were ordered for total hip protocol.  The patient was allowed to be PWB 50% with therapy. Discharge planning was consulted to help with postop disposition and equipment needs.  Patient had a fair night on the evening of surgery.  They started to get up OOB with therapy on day one. She initially had some issues with dizziness with therapy but that  resolved in session two. Continued to work with therapy into day two. Patient's Hgb continued to drop to 7.7 but remained asymptomatic. Hgb recheck scheduled for Friday afternoon. Depending on symptoms and Hgb, patient may receive Blood transfusion.  At plan of time of discharge summary, on post op day three, patient would continue therapy prior to discharge to SNF.   Addendum - Patient was seen on rounds on Saturday.  Her HGB had drifted down to 6.8.  She was in agreement with blood.  Transfuse two units and then transfer patient over to [facility] for further care. Arlee Muslim, [facility]  Discharge Medications: Prior to Admission medications   Medication Sig Start Date End Date Taking? Authorizing Provider  amLODipine-valsartan (EXFORGE) 5-320 MG per tablet Take 1 tablet by mouth every morning.   Yes Historical Provider, MD  esomeprazole (NEXIUM) 20 MG capsule Take 20 mg by mouth daily at 12 noon.   Yes Historical Provider, MD  levothyroxine (SYNTHROID, LEVOTHROID) 150 MCG tablet Take 150 mcg by mouth daily before breakfast.   Yes Historical Provider, MD  metFORMIN (GLUCOPHAGE-XR) 500 MG 24 hr tablet Take 1,000 mg by mouth daily with breakfast.   Yes Historical Provider, MD  simvastatin (ZOCOR) 40 MG tablet Take 40 mg by mouth every morning.   Yes Historical Provider, MD  docusate sodium (COLACE) 100 MG capsule Take 1 capsule (100 mg  total) by mouth 2 (two) times daily as needed for mild constipation. 01/04/14   Amber Renelda Loma, PA-C  ferrous sulfate 325 (65 FE) MG tablet Take 1 tablet (325 mg total) by mouth 3 (three) times daily after meals. 01/04/14   Amber Renelda Loma, PA-C  HYDROcodone-acetaminophen (NORCO/VICODIN) 5-325 MG per tablet Take 1-2 tablets by mouth every 4 (four) hours as needed for moderate pain. 01/04/14   Amber Renelda Loma, PA-C  methocarbamol (ROBAXIN) 500 MG tablet Take 1 tablet (500 mg total) by mouth every 6 (six) hours as needed for muscle spasms. 01/04/14   Amber  Renelda Loma, PA-C  rivaroxaban (XARELTO) 10 MG TABS tablet Take 1 tablet (10 mg total) by mouth daily with breakfast. 01/04/14   Amber Renelda Loma, PA-C    Diet: Diabetic diet Activity:PWB No bending hip over 90 degrees- A "L" Angle Do not cross legs Do not let foot roll inward When turning these patients a pillow should be placed between the patient's legs to prevent crossing. Patients should have the affected knee fully extended when trying to sit or stand from all surfaces to prevent excessive hip flexion. When ambulating and turning toward the affected side the affected leg should have the toes turned out prior to moving the walker and the rest of patient's body as to prevent internal rotation/ turning in of the leg. Abduction pillows are the most effective way to prevent a patient from not crossing legs or turning toes in at rest. If an abduction pillow is not ordered placing a regular pillow length wise between the patient's legs is also an effective reminder. It is imperative that these precautions be maintained so that the surgical hip does not dislocate. Follow-up:in 2 weeks Disposition - Skilled nursing facility Discharged Condition: stable   Discharge Orders   Future Orders Complete By Expires   Call MD / Call 911  As directed    Comments:     If you experience chest pain or shortness of breath, CALL 911 and be transported to the hospital emergency room.  If you develope a fever above 101 F, pus (white drainage) or increased drainage or redness at the wound, or calf pain, call your surgeon's office.   Constipation Prevention  As directed    Comments:     Drink plenty of fluids.  Prune juice may be helpful.  You may use a stool softener, such as Colace (over the counter) 100 mg twice a day.  Use MiraLax (over the counter) for constipation as needed.   Diet Carb Modified  As directed    Discharge instructions  As directed    Comments:     Walk with your  walker. Partial weightbearing 50% right LE for one week then progress to WBAT Do not change your dressing unless there is excess drainage. Shower only, no tub bath. Call if any temperatures greater than 101 or any wound complications: [phone] during the day and ask for Dr. Charlestine Night nurse, Brunilda Payor.   Driving restrictions  As directed    Comments:     No driving   Follow the hip precautions as taught in Physical Therapy  As directed    Increase activity slowly as tolerated  As directed        Medication List    STOP taking these medications       aspirin EC 81 MG tablet     Calcium-Magnesium-Zinc 500-250-12.5 MG Tabs     cholecalciferol 1000 UNITS tablet  Commonly known as:  VITAMIN D     potassium gluconate 595 MG Tabs tablet     vitamin E 400 UNIT capsule      TAKE these medications       amLODipine-valsartan 5-320 MG per tablet  Commonly known as:  EXFORGE  Take 1 tablet by mouth every morning.     docusate sodium 100 MG capsule  Commonly known as:  COLACE  Take 1 capsule (100 mg total) by mouth 2 (two) times daily as needed for mild constipation.     esomeprazole 20 MG capsule  Commonly known as:  NEXIUM  Take 20 mg by mouth daily at 12 noon.     ferrous sulfate 325 (65 FE) MG tablet  Take 1 tablet (325 mg total) by mouth 3 (three) times daily after meals.     HYDROcodone-acetaminophen 5-325 MG per tablet  Commonly known as:  NORCO/VICODIN  Take 1-2 tablets by mouth every 4 (four) hours as needed for moderate pain.     levothyroxine 150 MCG tablet  Commonly known as:  SYNTHROID, LEVOTHROID  Take 150 mcg by mouth daily before breakfast.     metFORMIN 500 MG 24 hr tablet  Commonly known as:  GLUCOPHAGE-XR  Take 1,000 mg by mouth daily with breakfast.     methocarbamol 500 MG tablet  Commonly known as:  ROBAXIN  Take 1 tablet (500 mg total) by mouth every 6 (six) hours as needed for muscle spasms.     rivaroxaban 10 MG Tabs tablet  Commonly known  as:  XARELTO  Take 1 tablet (10 mg total) by mouth daily with breakfast.     simvastatin 40 MG tablet  Commonly known as:  ZOCOR  Take 40 mg by mouth every morning.           Follow-up Information   Follow up with GIOFFRE,RONALD A, MD. Schedule an appointment as soon as possible for a visit in 2 weeks.   Specialty:  Orthopedic Surgery   Contact information:   [address] [place] [address] [phone]       Signed: Malachy Chamber 01/05/2014, 6:39 AM

## 2014-01-05 NOTE — Progress Notes (Signed)
Physical Therapy Treatment Patient Details Name: Jean Hamilton MRN: [id number] DOB: 07-30-1951 Today's Date: 03-Feb-2014    History of Present Illness Posterior  THA on R    PT Comments    Pt continues motivated and cooperative but fatigues easily.  No c/o dizziness with OOB activity  Follow Up Recommendations  SNF     Equipment Recommendations  None recommended by PT    Recommendations for Other Services       Precautions / Restrictions Precautions Precautions: Posterior Hip Precaution Booklet Issued: Yes (comment) Precaution Comments: Sign hung on wall, all precautions reviewed Restrictions Weight Bearing Restrictions: Yes RLE Weight Bearing: Partial weight bearing RLE Partial Weight Bearing Percentage or Pounds: 25-50%    Mobility  Bed Mobility Overal bed mobility: Needs Assistance Bed Mobility: Supine to Sit     Supine to sit: Mod assist;HOB elevated     General bed mobility comments: used bed rails; cues for sequence and use of L LE to self assist  Transfers Overall transfer level: Needs assistance Equipment used: Rolling walker (2 wheeled) Transfers: Sit to/from Stand Sit to Stand: Mod assist         General transfer comment: cues for UE/LE placement  Ambulation/Gait Ambulation/Gait assistance: Mod assist Ambulation Distance (Feet): 16 Feet Assistive device: Rolling walker (2 wheeled) Gait Pattern/deviations: Step-to pattern;Decreased step length - right;Decreased step length - left;Shuffle;Trunk flexed Gait velocity: decr   General Gait Details: Cues for sequence, stride length, posture, position from RW and ER on L   Stairs            Wheelchair Mobility    Modified Rankin (Stroke Patients Only)       Balance                                    Cognition Arousal/Alertness: Awake/alert Behavior During Therapy: WFL for tasks assessed/performed Overall Cognitive Status: Within Functional Limits for tasks assessed                      Exercises Total Joint Exercises Ankle Circles/Pumps: AROM;Both;Supine;15 reps Quad Sets: AROM;10 reps;Supine;Both Heel Slides: AAROM;Right;Supine;20 reps Hip ABduction/ADduction: AAROM;Right;Supine;15 reps    General Comments        Pertinent Vitals/Pain 4/10; premed; ice pack provided    Home Living Family/patient expects to be discharged to:: Skilled nursing facility                    Prior Function Level of Independence: Independent with assistive device(s)          PT Goals (current goals can now be found in the care plan section) Acute Rehab PT Goals Patient Stated Goal: Resume previous lifestyle with decreased pain PT Goal Formulation: With patient Time For Goal Achievement: 01/11/14 Potential to Achieve Goals: Good Progress towards PT goals: Progressing toward goals    Frequency  7X/week    PT Plan Current plan remains appropriate    End of Session Equipment Utilized During Treatment: Gait belt Activity Tolerance: Patient tolerated treatment well Patient left: in chair;with call bell/phone within reach     Time: 0943-1016 PT Time Calculation (min): 33 min  Charges:  $Gait Training: 8-22 mins $Therapeutic Exercise: 8-22 mins                    G Codes:      Racquelle Hyser Feb 03, 2014, 12:31 PM

## 2014-01-05 NOTE — Progress Notes (Signed)
MD notified of patient's 1500 hemoglobin of 7.5. Verbal order to hold transfusion until hemoglobin drops below 7.0 or if the patient becomes symptomatic.

## 2014-01-06 LAB — CBC
HCT: 20.4 % — ABNORMAL LOW (ref 36.0–46.0)
Hemoglobin: 6.8 g/dL — CL (ref 12.0–15.0)
MCH: 28.5 pg (ref 26.0–34.0)
MCHC: 33.3 g/dL (ref 30.0–36.0)
MCV: 85.4 fL (ref 78.0–100.0)
PLATELETS: 108 10*3/uL — AB (ref 150–400)
RBC: 2.39 MIL/uL — AB (ref 3.87–5.11)
RDW: 12.8 % (ref 11.5–15.5)
WBC: 13.5 10*3/uL — AB (ref 4.0–10.5)

## 2014-01-06 LAB — GLUCOSE, CAPILLARY
GLUCOSE-CAPILLARY: 146 mg/dL — AB (ref 70–99)
GLUCOSE-CAPILLARY: 109 mg/dL — AB (ref 70–99)
Glucose-Capillary: 131 mg/dL — ABNORMAL HIGH (ref 70–99)
Glucose-Capillary: 133 mg/dL — ABNORMAL HIGH (ref 70–99)

## 2014-01-06 MED ORDER — ACETAMINOPHEN 325 MG PO TABS
650.0000 mg | ORAL_TABLET | Freq: Once | ORAL | Status: AC
  Administered 2014-01-06: 650 mg via ORAL
  Filled 2014-01-06: qty 2

## 2014-01-06 NOTE — Progress Notes (Signed)
Physical Therapy Treatment Patient Details Name: Jean Hamilton MRN: [id number] DOB: 1951/05/17 Today's Date: 01/30/14    History of Present Illness Posterior  THA on R    PT Comments    OOB deferred.  Hgb@6 .8 with transfusion in progress.  Follow Up Recommendations  SNF     Equipment Recommendations  None recommended by PT    Recommendations for Other Services       Precautions / Restrictions Precautions Precautions: Posterior Hip Precaution Comments: Pt recalls 2/3 THP without cues: all post THP reviewed Restrictions Weight Bearing Restrictions: Yes RLE Weight Bearing: Partial weight bearing RLE Partial Weight Bearing Percentage or Pounds: 25-50%    Mobility  Bed Mobility Overal bed mobility:  (NT - OOB deferred - transfusion in progress)                Transfers                    Ambulation/Gait                 Stairs            Wheelchair Mobility    Modified Rankin (Stroke Patients Only)       Balance                                    Cognition Arousal/Alertness: Awake/alert Behavior During Therapy: WFL for tasks assessed/performed Overall Cognitive Status: Within Functional Limits for tasks assessed                      Exercises Total Joint Exercises Ankle Circles/Pumps: AROM;Both;Supine;15 reps Quad Sets: AROM;10 reps;Supine;Both Gluteal Sets: AROM;Both;10 reps;Supine Heel Slides: AAROM;Right;Supine;20 reps Hip ABduction/ADduction: AAROM;Right;Supine;15 reps    General Comments        Pertinent Vitals/Pain 4/10; pt declines pain meds, ice packs provided    Home Living                      Prior Function            PT Goals (current goals can now be found in the care plan section) Acute Rehab PT Goals Patient Stated Goal: Resume previous lifestyle with decreased pain PT Goal Formulation: With patient Time For Goal Achievement: 01/11/14 Potential to Achieve Goals:  Good Progress towards PT goals: Progressing toward goals    Frequency  7X/week    PT Plan Current plan remains appropriate    End of Session   Activity Tolerance: Patient tolerated treatment well Patient left: in bed;with call bell/phone within reach;with family/visitor present     Time: 4580-9983 PT Time Calculation (min): 15 min  Charges:  $Therapeutic Exercise: 8-22 mins                    G Codes:      Jean Hamilton January 30, 2014, 12:47 PM

## 2014-01-06 NOTE — Progress Notes (Signed)
   Subjective: 3 Days Post-Op Procedure(s) (LRB): RIGHT TOTAL HIP ARTHROPLASTY (Right) Patient reports pain as mild.   Patient seen in rounds for Dr. Gladstone Lighter. Patient is well, and has had no acute complaints or problems Patient is ready to go to [facility].  Objective: Vital signs in last 24 hours: Temp:  [97.2 F (36.2 C)-100.6 F (38.1 C)] 100.1 F (37.8 C) (03/28 1100) Pulse Rate:  [82-98] 89 (03/28 1100) Resp:  [16-18] 16 (03/28 1100) BP: (108-144)/(61-72) 133/64 mmHg (03/28 1100) SpO2:  [96 %-99 %] 99 % (03/28 1100)  Intake/Output from previous day:  Intake/Output Summary (Last 24 hours) at 01/06/14 1119 Last data filed at 01/06/14 0943  Gross per 24 hour  Intake   1370 ml  Output   1500 ml  Net   -130 ml    Intake/Output this shift: Total I/O In: 490 [P.O.:240; I.V.:250] Out: -   Labs:  Recent Labs  01/04/14 0418 01/04/14 1434 01/05/14 0358 01/05/14 1545 01/06/14 0532  HGB 8.6* 8.6* 7.7* 7.5* 6.8*    Recent Labs  01/05/14 0358 01/05/14 1545 01/06/14 0532  WBC 12.4*  --  13.5*  RBC 2.64*  --  2.39*  HCT 21.9* 22.0* 20.4*  PLT 108*  --  108*    Recent Labs  01/04/14 0418 01/05/14 0358  NA 136* 141  K 4.6 5.0  CL 102 105  CO2 26 29  BUN 9 12  CREATININE 0.61 0.65  GLUCOSE 131* 148*  CALCIUM 9.0 8.9   No results found for this basename: LABPT, INR,  in the last 72 hours  EXAM: General - Patient is Alert, Appropriate and Oriented Extremity - Neurovascular intact Sensation intact distally Incision - clean, dry, no drainage Motor Function - intact, moving foot and toes well on exam.   Assessment/Plan: 3 Days Post-Op Procedure(s) (LRB): RIGHT TOTAL HIP ARTHROPLASTY (Right) Procedure(s) (LRB): RIGHT TOTAL HIP ARTHROPLASTY (Right) Past Medical History  Diagnosis Date  . Hypertension   . Sleep apnea     no cpap use since weight loss about 10 yrs ago  . Hypothyroidism   . Diabetes mellitus without complication   . GERD  (gastroesophageal reflux disease)   . Arthritis     oa  . Numbness last few weeks    left index finger   Active Problems:   Osteoarthritis of right hip   History of total right hip arthroplasty   Acute blood loss anemia  Estimated body mass index is 31.54 kg/(m^2) as calculated from the following:   Height as of this encounter: 5\' 10"  (1.778 m).   Weight as of this encounter: 99.7 kg (219 lb 12.8 oz). Discharge to SNF Diet - Cardiac diet and Diabetic diet Follow up - in 2 weeks Activity - PWB 50 % Disposition - Skilled nursing facility - [facility] Condition Upon Discharge - Good D/C Meds - See DC Summary DVT Prophylaxis - Xarelto  PERKINS, ALEXZANDREW 01/06/2014, 11:19 AM

## 2014-01-06 NOTE — Progress Notes (Signed)
Discussed via phone w/PA, Arlee Muslim, re hgb. He will place orders for transfusion. Passed to him question from pharm re xarelto vs hgb: give or hold? He said to hold until he comes in on rounds later this am. Trygve Thal, [employer]

## 2014-01-06 NOTE — Progress Notes (Signed)
Rec'd critical lab of HGB 6.8. Orders have been placed for 2 units of PRBCs yesterday. Spoke with patient and she is ok with getting the transfusion today. Will continue to monitor.

## 2014-01-06 NOTE — Progress Notes (Signed)
Physical Therapy Treatment Patient Details Name: Jean Hamilton MRN: [id number] DOB: 1951-05-20 Today's Date: 2014-01-28    History of Present Illness Posterior  THA on R    PT Comments      Follow Up Recommendations  SNF     Equipment Recommendations  None recommended by PT    Recommendations for Other Services       Precautions / Restrictions Precautions Precautions: Posterior Hip Precaution Comments: Pt recalls all THP Restrictions Weight Bearing Restrictions: Yes RLE Weight Bearing: Partial weight bearing RLE Partial Weight Bearing Percentage or Pounds: 25-50%    Mobility  Bed Mobility Overal bed mobility: Needs Assistance Bed Mobility: Supine to Sit     Supine to sit: Min assist;HOB elevated     General bed mobility comments: used bed rails; cues for sequence and use of L LE to self assist  Transfers Overall transfer level: Needs assistance Equipment used: Rolling walker (2 wheeled) Transfers: Sit to/from Stand Sit to Stand: Min assist         General transfer comment: cues for UE/LE placement  Ambulation/Gait Ambulation/Gait assistance: Min assist Ambulation Distance (Feet): 20 Feet (twice) Assistive device: Rolling walker (2 wheeled) Gait Pattern/deviations: Step-to pattern;Decreased step length - right;Decreased step length - left;Shuffle;Trunk flexed;Antalgic Gait velocity: decr   General Gait Details: Cues for sequence, stride length, posture, position from RW and ER on L   Stairs            Wheelchair Mobility    Modified Rankin (Stroke Patients Only)       Balance                                    Cognition Arousal/Alertness: Awake/alert Behavior During Therapy: WFL for tasks assessed/performed Overall Cognitive Status: Within Functional Limits for tasks assessed                      Exercises Total Joint Exercises Ankle Circles/Pumps: AROM;Both;Supine;15 reps Quad Sets: AROM;10  reps;Supine;Both Gluteal Sets: AROM;Both;10 reps;Supine Heel Slides: AAROM;Right;Supine;20 reps Hip ABduction/ADduction: AAROM;Right;Supine;15 reps    General Comments        Pertinent Vitals/Pain 4/10; pt declines pain meds, ice pack provided    Home Living                      Prior Function            PT Goals (current goals can now be found in the care plan section) Acute Rehab PT Goals Patient Stated Goal: Resume previous lifestyle with decreased pain PT Goal Formulation: With patient Time For Goal Achievement: 01/11/14 Potential to Achieve Goals: Good Progress towards PT goals: Progressing toward goals    Frequency  7X/week    PT Plan Current plan remains appropriate    End of Session Equipment Utilized During Treatment: Gait belt Activity Tolerance: Patient tolerated treatment well;Patient limited by fatigue Patient left: in chair;with call bell/phone within reach;with family/visitor present     Time: 0109-3235 PT Time Calculation (min): 33 min  Charges:  $Gait Training: 8-22 mins $Therapeutic Exercise: 8-22 mins $Therapeutic Activity: 8-22 mins                    G Codes:      Warren Kugelman 01/28/2014, 12:58 PM

## 2014-01-06 NOTE — Progress Notes (Signed)
Report given to Neoma Laming, Therapist, sports at [facility].  Pt transported via [facility] to [facility] and paperwork sent with [facility].

## 2014-01-06 NOTE — Progress Notes (Signed)
Per MD, Pt ready for d/c.  Per RN, Pt needs 2 units of blood and will not be ready for transport to SNF until 4 or after.  Notified Neoma Laming, Therapist, sports at [facility].  Admission arranged, with d/c summary faxed yesterday.    Per MD, Pt ready for d/c.  Notified Pt.  Pt stated that family is aware and that she will notify her husband of the time change.  CSW to arrange for transportation.  Bernita Raisin, [employer] [phone] '

## 2014-01-07 LAB — TYPE AND SCREEN
ABO/RH(D): B POS
Antibody Screen: NEGATIVE
Unit division: 0
Unit division: 0

## 2014-01-08 ENCOUNTER — Encounter: Payer: Self-pay | Admitting: *Deleted

## 2014-01-08 NOTE — Progress Notes (Signed)
Clinical Social Work Department CLINICAL SOCIAL WORK PLACEMENT NOTE 01/08/2014  Patient:  Jean Hamilton, Jean Hamilton  Account Number:  [id number] Admit date:  01/03/2014  Clinical Social Worker:  Werner Lean, LCSW  Date/time:  01/04/2014 01:20 PM  Clinical Social Work is seeking post-discharge placement for this patient at the following level of care:   SKILLED NURSING   (*CSW will update this form in Epic as items are completed)     Patient/family provided with [place] Department of Clinical Social Work's list of facilities offering this level of care within the geographic area requested by the patient (or if unable, by the patient's family).  01/04/2014  Patient/family informed of their freedom to choose among providers that offer the needed level of care, that participate in Medicare, Medicaid or managed care program needed by the patient, have an available bed and are willing to accept the patient.    Patient/family informed of [facility]' ownership interest in [facility], as well as of the fact that they are under no obligation to receive care at this facility.  PASARR submitted to EDS on 01/04/2014 PASARR number received from EDS on 01/04/2014  FL2 transmitted to all facilities in geographic area requested by pt/family on  01/04/2014 FL2 transmitted to all facilities within larger geographic area on   Patient informed that his/her managed care company has contracts with or will negotiate with  certain facilities, including the following:     Patient/family informed of bed offers received:  01/04/2014 Patient chooses bed at [facility] Physician recommends and patient chooses bed at    Patient to be transferred to [facility] on  01/05/2014 Patient to be transferred to facility by EMS  The following physician request were entered in Epic:   Additional Comments:  [employer] provided authorization for SNF placement.   Werner Lean LCSW [phone]

## 2014-01-12 ENCOUNTER — Encounter: Payer: Self-pay | Admitting: Internal Medicine

## 2014-01-12 ENCOUNTER — Non-Acute Institutional Stay (SKILLED_NURSING_FACILITY): Payer: Managed Care, Other (non HMO) | Admitting: Internal Medicine

## 2014-01-12 DIAGNOSIS — E039 Hypothyroidism, unspecified: Secondary | ICD-10-CM | POA: Insufficient documentation

## 2014-01-12 DIAGNOSIS — Z96649 Presence of unspecified artificial hip joint: Secondary | ICD-10-CM

## 2014-01-12 DIAGNOSIS — E119 Type 2 diabetes mellitus without complications: Secondary | ICD-10-CM | POA: Insufficient documentation

## 2014-01-12 DIAGNOSIS — E785 Hyperlipidemia, unspecified: Secondary | ICD-10-CM

## 2014-01-12 DIAGNOSIS — Z96641 Presence of right artificial hip joint: Secondary | ICD-10-CM

## 2014-01-12 DIAGNOSIS — I10 Essential (primary) hypertension: Secondary | ICD-10-CM

## 2014-01-12 DIAGNOSIS — D62 Acute posthemorrhagic anemia: Secondary | ICD-10-CM

## 2014-01-12 NOTE — Assessment & Plan Note (Signed)
Continue levothyroxine 

## 2014-01-12 NOTE — Assessment & Plan Note (Signed)
Continue metformin.

## 2014-01-12 NOTE — Assessment & Plan Note (Signed)
Continue exforge

## 2014-01-12 NOTE — Assessment & Plan Note (Signed)
2/2 end stage OA; pain meds, robaxin and xarelto for prophylaxis

## 2014-01-12 NOTE — Assessment & Plan Note (Signed)
Continue zocor 

## 2014-01-12 NOTE — Progress Notes (Signed)
MRN: [id number] Name: Jean Hamilton  Sex: female Age: 63 y.o. DOB: Mar 15, 1951  [facility] #: [facility] Facility/Room: 808 Level Of Care: SNF Provider: Inocencio Homes D Emergency Contacts: Extended Emergency Contact Information Primary Emergency Contact: Devol,James Address: [address]          [place], [place] [address] [place] Phone: [phone] Mobile Phone: [phone] Relation: Spouse Secondary Emergency Contact: Erwin,Renee Address: [address]          [place], [place] [address] [place] Mobile Phone: [phone] Relation: Daughter  Code Status: FULL  Allergies: Oxycodone and Sulfa antibiotics  Chief Complaint  Patient presents with  . nursing home admission    HPI: Patient is 63 y.o. female who is admitted for OT/PT after R hip arthroplasty.  Past Medical History  Diagnosis Date  . Sleep apnea     no cpap use since weight loss about 10 yrs ago  . Hypothyroidism   . GERD (gastroesophageal reflux disease)   . Arthritis     oa  . Numbness last few weeks    left index finger  . Hypertension   . Diabetes mellitus without complication   . Hyperlipidemia     Past Surgical History  Procedure Laterality Date  . Abdominal hysterectomy  1981    1 ovary removed  . Ganglion cyst removed Right yrs ago    wrist  . Hammer toe surgery Left few yrs ago    and left foot surgery  . Total hip arthroplasty Right 01/03/2014    Procedure: RIGHT TOTAL HIP ARTHROPLASTY;  Surgeon: Tobi Bastos, MD;  Location: [facility];  Service: Orthopedics;  Laterality: Right;      Medication List       This list is accurate as of: 01/12/14  9:44 PM.  Always use your most recent med list.               amLODipine-valsartan 5-320 MG per tablet  Commonly known as:  EXFORGE  Take 1 tablet by mouth every morning.     docusate sodium 100 MG capsule  Commonly known as:  COLACE  Take 1 capsule (100 mg total) by mouth 2 (two) times daily as needed  for mild constipation.     esomeprazole 20 MG capsule  Commonly known as:  NEXIUM  Take 20 mg by mouth daily at 12 noon.     ferrous sulfate 325 (65 FE) MG tablet  Take 1 tablet (325 mg total) by mouth 3 (three) times daily after meals.     HYDROcodone-acetaminophen 5-325 MG per tablet  Commonly known as:  NORCO/VICODIN  Take 1-2 tablets by mouth every 4 (four) hours as needed for moderate pain.     levothyroxine 150 MCG tablet  Commonly known as:  SYNTHROID, LEVOTHROID  Take 150 mcg by mouth daily before breakfast.     metFORMIN 500 MG 24 hr tablet  Commonly known as:  GLUCOPHAGE-XR  Take 1,000 mg by mouth daily with breakfast.     methocarbamol 500 MG tablet  Commonly known as:  ROBAXIN  Take 1 tablet (500 mg total) by mouth every 6 (six) hours as needed for muscle spasms.     rivaroxaban 10 MG Tabs tablet  Commonly known as:  XARELTO  Take 1 tablet (10 mg total) by mouth daily with breakfast.     simvastatin 40 MG tablet  Commonly known as:  ZOCOR  Take 40 mg by mouth every morning.  No orders of the defined types were placed in this encounter.     There is no immunization history on file for this patient.  History  Substance Use Topics  . Smoking status: Former Smoker -- 0.25 packs/day for 20 years    Types: Cigarettes    Quit date: 10/12/1993  . Smokeless tobacco: Never Used  . Alcohol Use: No    Family history is noncontributory    Review of Systems  DATA OBTAINED: from patient GENERAL: Feels well no fevers, fatigue, appetite changes SKIN: No itching, rash or wounds EYES: No eye pain, redness, discharge EARS: No earache, tinnitus, change in hearing NOSE: No congestion, drainage or bleeding  MOUTH/THROAT: No mouth or tooth pain, No sore throat RESPIRATORY: No cough, wheezing, SOB CARDIAC: No chest pain, palpitations, lower extremity edema  GI: No abdominal pain, No N/V/D or constipation, No heartburn or reflux  GU: No dysuria, frequency or  urgency, or incontinence  MUSCULOSKELETAL: No unrelieved bone/joint pain NEUROLOGIC: No headache, dizziness or focal weakness PSYCHIATRIC: No overt anxiety or sadness. Sleeps well. No behavior issue.   Filed Vitals:   01/12/14 2130  BP: 134/74  Pulse: 93  Temp: 98.8 F (37.1 C)  Resp: 20    Physical Exam  GENERAL APPEARANCE: Alert, conversant. Appropriately groomed. No acute distress.  SKIN: No diaphoresis rash HEAD: Normocephalic, atraumatic  EYES: Conjunctiva/lids clear. Pupils round, reactive. EOMs intact.  EARS: External exam WNL, canals clear. Hearing grossly normal.  NOSE: No deformity or discharge.  MOUTH/THROAT: Lips w/o lesions RESPIRATORY: Breathing is even, unlabored. Lung sounds are clear   CARDIOVASCULAR: Heart RRR no murmurs, rubs or gallops. No peripheral edema.  GASTROINTESTINAL: Abdomen is soft, non-tender, not distended w/ normal bowel sounds GENITOURINARY: Bladder non tender, not distended  MUSCULOSKELETAL: No abnormal joints or musculature NEUROLOGIC: Oriented X3. Cranial nerves 2-12 grossly intact. Moves all extremities no tremor. PSYCHIATRIC: Mood and affect appropriate to situation, no behavioral issues  Patient Active Problem List   Diagnosis Date Noted  . Unspecified hypothyroidism 01/12/2014  . Hypertension   . Diabetes mellitus without complication   . Hyperlipidemia   . Acute blood loss anemia 01/05/2014  . Osteoarthritis of right hip 01/03/2014  . History of total right hip arthroplasty 01/03/2014    CBC    Component Value Date/Time   WBC 13.5* 01/06/2014 0532   RBC 2.39* 01/06/2014 0532   HGB 6.8* 01/06/2014 0532   HCT 20.4* 01/06/2014 0532   PLT 108* 01/06/2014 0532   MCV 85.4 01/06/2014 0532   LYMPHSABS 1.7 07/21/2010 1010   MONOABS 0.4 07/21/2010 1010   EOSABS 0.1 07/21/2010 1010   BASOSABS 0.2* 07/21/2010 1010    CMP     Component Value Date/Time   NA 141 01/05/2014 0358   K 5.0 01/05/2014 0358   CL 105 01/05/2014 0358   CO2 29  01/05/2014 0358   GLUCOSE 148* 01/05/2014 0358   BUN 12 01/05/2014 0358   CREATININE 0.65 01/05/2014 0358   CALCIUM 8.9 01/05/2014 0358   PROT 8.4* 12/28/2013 1430   ALBUMIN 4.1 12/28/2013 1430   AST 27 12/28/2013 1430   ALT 27 12/28/2013 1430   ALKPHOS 69 12/28/2013 1430   BILITOT 0.3 12/28/2013 1430   GFRNONAA >90 01/05/2014 0358   GFRAA >90 01/05/2014 0358    Assessment and Plan  History of total right hip arthroplasty 2/2 end stage OA; pain meds, robaxin and xarelto for prophylaxis  Acute blood loss anemia Hb fell from 8.6 to 6.8; not stated  if pt was tx; will monitor;pt is on iron BID  Hypertension Continue exforge  Diabetes mellitus without complication Continue metformin  Unspecified hypothyroidism Continue levothyroxine  Hyperlipidemia Continue zocor    Hennie Duos, MD

## 2014-01-12 NOTE — Assessment & Plan Note (Signed)
Hb fell from 8.6 to 6.8; not stated if pt was tx; will monitor;pt is on iron BID

## 2014-01-16 ENCOUNTER — Encounter: Payer: Self-pay | Admitting: Adult Health

## 2014-01-16 ENCOUNTER — Non-Acute Institutional Stay (SKILLED_NURSING_FACILITY): Payer: Managed Care, Other (non HMO) | Admitting: Adult Health

## 2014-01-16 DIAGNOSIS — M1610 Unilateral primary osteoarthritis, unspecified hip: Secondary | ICD-10-CM

## 2014-01-16 DIAGNOSIS — I10 Essential (primary) hypertension: Secondary | ICD-10-CM

## 2014-01-16 DIAGNOSIS — E785 Hyperlipidemia, unspecified: Secondary | ICD-10-CM

## 2014-01-16 DIAGNOSIS — M1611 Unilateral primary osteoarthritis, right hip: Secondary | ICD-10-CM

## 2014-01-16 DIAGNOSIS — E039 Hypothyroidism, unspecified: Secondary | ICD-10-CM

## 2014-01-16 DIAGNOSIS — M169 Osteoarthritis of hip, unspecified: Secondary | ICD-10-CM

## 2014-01-16 DIAGNOSIS — D62 Acute posthemorrhagic anemia: Secondary | ICD-10-CM

## 2014-01-16 DIAGNOSIS — K219 Gastro-esophageal reflux disease without esophagitis: Secondary | ICD-10-CM

## 2014-01-17 DIAGNOSIS — K219 Gastro-esophageal reflux disease without esophagitis: Secondary | ICD-10-CM | POA: Insufficient documentation

## 2014-01-17 NOTE — Progress Notes (Signed)
Patient ID: Jean Hamilton, female   DOB: 09/01/1951, 63 y.o.   MRN: [id number]              PROGRESS NOTE  DATE: 01/16/2014   FACILITY: [facility]  LEVEL OF CARE: SNF (31)  Acute Visit  CHIEF COMPLAINT:  Discharge Notes  HISTORY OF PRESENT ILLNESS: This is a 63 year old female who is for discharge home with Home health PT, OT and Nursing. DME: Rolling walker and bedside commode. She has been admitted to [facility] on 01/06/14 from [facility] with Osteoarthritis S/P right total hip arthroplasty. Patient was admitted to this facility for short-term rehabilitation after the patient's recent hospitalization.  Patient has completed SNF rehabilitation and therapy has cleared the patient for discharge.  Reassessment of ongoing problem(s):  HTN: Pt 's HTN remains stable.  Denies CP, sob, DOE, pedal edema, headaches, dizziness or visual disturbances.  No complications from the medications currently being used.  Last BP : 136/75  ANEMIA: The anemia has been stable. The patient denies fatigue, melena or hematochezia. No complications from the medications currently being used. 3/15 hgb 6.8  GERD: pt's GERD is stable.  Denies ongoing heartburn, abd. Pain, nausea or vomiting.  Currently on a PPI & tolerates it without any adverse reactions.  PAST MEDICAL HISTORY : Reviewed.  No changes.  CURRENT MEDICATIONS: Reviewed per [facility]  REVIEW OF SYSTEMS:  GENERAL: no change in appetite, no fatigue, no weight changes, no fever, chills or weakness RESPIRATORY: no cough, SOB, DOE, wheezing, hemoptysis CARDIAC: no chest pain, or palpitations, +edema  GI: no abdominal pain, diarrhea, constipation, heart burn, nausea or vomiting  PHYSICAL EXAMINATION  GENERAL: no acute distress, normal body habitus EYES: conjunctivae normal, sclerae normal, normal eye lids NECK: supple, trachea midline, no neck masses, no thyroid tenderness, no thyromegaly LYMPHATICS: no LAN in the neck, no  supraclavicular LAN RESPIRATORY: breathing is even & unlabored, BS CTAB CARDIAC: RRR, no murmur,no extra heart sounds, +RLE edema 2+ GI: abdomen soft, normal BS, no masses, no tenderness, no hepatomegaly, no splenomegaly EXTREMITIES: able to move all 4 extremities PSYCHIATRIC: the patient is alert & oriented to person, affect & behavior appropriate  LABS/RADIOLOGY: Labs reviewed: Basic Metabolic Panel:  Recent Labs  12/28/13 1430 01/04/14 0418 01/05/14 0358  NA 139 136* 141  K 4.3 4.6 5.0  CL 101 102 105  CO2 24 26 29   GLUCOSE 106* 131* 148*  BUN 10 9 12   CREATININE 0.64 0.61 0.65  CALCIUM 10.1 9.0 8.9   Liver Function Tests:  Recent Labs  12/28/13 1430  AST 27  ALT 27  ALKPHOS 69  BILITOT 0.3  PROT 8.4*  ALBUMIN 4.1   CBC:  Recent Labs  01/04/14 0418  01/05/14 0358 01/05/14 1545 01/06/14 0532  WBC 10.1  --  12.4*  --  13.5*  HGB 8.6*  < > 7.7* 7.5* 6.8*  HCT 26.1*  < > 21.9* 22.0* 20.4*  MCV 84.2  --  83.0  --  85.4  PLT 120*  --  108*  --  108*  < > = values in this interval not displayed. CBG:  Recent Labs  01/06/14 0731 01/06/14 1212 01/06/14 1634  GLUCAP 131* 109* 133*    ASSESSMENT/PLAN:  Osteoarthritis status post right total hip arthroplasty - for home health PT, OT and nursing Hypertension - well controlled; continue Exforge Hyperlipidemia - continue Simvastatin GERD - continue Nexium Anemia - continue FeSO4; CBC in AM Hypothyroidism - continue Synthroid  I have filled out patient's discharge paperwork and written prescriptions.  Patient will receive home health PT, OT and Nursing.   DME provided: Rolling walker and bedside commode  Total discharge time: Greater than 30 minutes Discharge time involved coordination of the discharge process with Education officer, museum, nursing staff and therapy department. Medical justification for home health services/DME verified.  CPT CODE: 06269  Seth Bake - NP [facility] [phone]

## 2014-11-19 ENCOUNTER — Encounter (HOSPITAL_BASED_OUTPATIENT_CLINIC_OR_DEPARTMENT_OTHER): Payer: Self-pay | Admitting: Emergency Medicine

## 2014-11-19 ENCOUNTER — Emergency Department (HOSPITAL_BASED_OUTPATIENT_CLINIC_OR_DEPARTMENT_OTHER)
Admission: EM | Admit: 2014-11-19 | Discharge: 2014-11-20 | Disposition: A | Discharge status: Home / Self Care | Payer: Managed Care, Other (non HMO) | Attending: Emergency Medicine | Admitting: Emergency Medicine

## 2014-11-19 DIAGNOSIS — K219 Gastro-esophageal reflux disease without esophagitis: Secondary | ICD-10-CM | POA: Diagnosis not present

## 2014-11-19 DIAGNOSIS — Z7901 Long term (current) use of anticoagulants: Secondary | ICD-10-CM | POA: Insufficient documentation

## 2014-11-19 DIAGNOSIS — E119 Type 2 diabetes mellitus without complications: Secondary | ICD-10-CM | POA: Insufficient documentation

## 2014-11-19 DIAGNOSIS — I10 Essential (primary) hypertension: Secondary | ICD-10-CM | POA: Diagnosis not present

## 2014-11-19 DIAGNOSIS — Z87891 Personal history of nicotine dependence: Secondary | ICD-10-CM | POA: Insufficient documentation

## 2014-11-19 DIAGNOSIS — M1990 Unspecified osteoarthritis, unspecified site: Secondary | ICD-10-CM | POA: Diagnosis not present

## 2014-11-19 DIAGNOSIS — M549 Dorsalgia, unspecified: Secondary | ICD-10-CM | POA: Diagnosis not present

## 2014-11-19 DIAGNOSIS — Z79899 Other long term (current) drug therapy: Secondary | ICD-10-CM | POA: Insufficient documentation

## 2014-11-19 DIAGNOSIS — M542 Cervicalgia: Secondary | ICD-10-CM | POA: Diagnosis present

## 2014-11-19 DIAGNOSIS — T148 Other injury of unspecified body region: Secondary | ICD-10-CM | POA: Insufficient documentation

## 2014-11-19 DIAGNOSIS — Z8669 Personal history of other diseases of the nervous system and sense organs: Secondary | ICD-10-CM | POA: Insufficient documentation

## 2014-11-19 DIAGNOSIS — T148XXA Other injury of unspecified body region, initial encounter: Secondary | ICD-10-CM

## 2014-11-19 DIAGNOSIS — E039 Hypothyroidism, unspecified: Secondary | ICD-10-CM | POA: Diagnosis not present

## 2014-11-19 MED ORDER — HYDROCODONE-ACETAMINOPHEN 5-325 MG PO TABS
1.0000 | ORAL_TABLET | Freq: Once | ORAL | Status: AC
  Administered 2014-11-19: 1 via ORAL
  Filled 2014-11-19: qty 1

## 2014-11-19 MED ORDER — DIAZEPAM 5 MG PO TABS
5.0000 mg | ORAL_TABLET | Freq: Once | ORAL | Status: AC
  Administered 2014-11-19: 5 mg via ORAL
  Filled 2014-11-19: qty 1

## 2014-11-19 NOTE — ED Notes (Signed)
Sharp pain to right shoulder that started on Saturday, denies injury

## 2014-11-19 NOTE — ED Provider Notes (Signed)
CSN: [id number]     Arrival date & time 11/19/14  2315 History   First MD Initiated Contact with Patient 11/19/14 2328     Chief Complaint  Patient presents with  . Shoulder Pain    (Consider location/radiation/quality/duration/timing/severity/associated sxs/prior Treatment) HPI Jean Hamilton is a 64 yo female presenting with report of arm, neck and back pain.  She states the pain started 2 days ago when she woke up.  She felt like she had a stiff neck.  As the day progressed, the felt a "twinge" in her back specifically with certain movements like raising her arm.  She states raising her arm also causes a twinge of pain in her axilla and the pain sometimes radiates down her right arm.  She denies any numbness, tingling, chest pain, shortness of breath or worsening pain with exertion.  Past Medical History  Diagnosis Date  . Sleep apnea     no cpap use since weight loss about 10 yrs ago  . Hypothyroidism   . GERD (gastroesophageal reflux disease)   . Arthritis     oa  . Numbness last few weeks    left index finger  . Hypertension   . Diabetes mellitus without complication   . Hyperlipidemia    Past Surgical History  Procedure Laterality Date  . Abdominal hysterectomy  1981    1 ovary removed  . Ganglion cyst removed Right yrs ago    wrist  . Hammer toe surgery Left few yrs ago    and left foot surgery  . Total hip arthroplasty Right 01/03/2014    Procedure: RIGHT TOTAL HIP ARTHROPLASTY;  Surgeon: Tobi Bastos, MD;  Location: [facility];  Service: Orthopedics;  Laterality: Right;   History reviewed. No pertinent family history. History  Substance Use Topics  . Smoking status: Former Smoker -- 0.25 packs/day for 20 years    Types: Cigarettes    Quit date: 10/12/1993  . Smokeless tobacco: Never Used  . Alcohol Use: No   OB History    No data available     Review of Systems  Constitutional: Negative for fever.  Respiratory: Negative for shortness of breath.     Cardiovascular: Negative for chest pain.  Gastrointestinal: Negative for nausea and vomiting.  Musculoskeletal: Positive for myalgias and back pain.      Allergies  Oxycodone and Sulfa antibiotics  Home Medications   Prior to Admission medications   Medication Sig Start Date End Date Taking? Authorizing Provider  amLODipine-valsartan (EXFORGE) 5-320 MG per tablet Take 1 tablet by mouth every morning.   Yes Historical Provider, MD  esomeprazole (NEXIUM) 20 MG capsule Take 20 mg by mouth daily at 12 noon.   Yes Historical Provider, MD  furosemide (LASIX) 20 MG tablet Take 20 mg by mouth.   Yes Historical Provider, MD  HYDROcodone-acetaminophen (NORCO/VICODIN) 5-325 MG per tablet Take 1-2 tablets by mouth every 4 (four) hours as needed for moderate pain. 01/04/14  Yes Amber Renelda Loma, PA-C  levothyroxine (SYNTHROID, LEVOTHROID) 150 MCG tablet Take 150 mcg by mouth daily before breakfast.   Yes Historical Provider, MD  metFORMIN (GLUCOPHAGE-XR) 500 MG 24 hr tablet Take 1,000 mg by mouth daily with breakfast.   Yes Historical Provider, MD  methocarbamol (ROBAXIN) 500 MG tablet Take 1 tablet (500 mg total) by mouth every 6 (six) hours as needed for muscle spasms. 01/04/14  Yes Amber Renelda Loma, PA-C  simvastatin (ZOCOR) 40 MG tablet Take 40 mg by mouth every morning.  Yes Historical Provider, MD  docusate sodium (COLACE) 100 MG capsule Take 1 capsule (100 mg total) by mouth 2 (two) times daily as needed for mild constipation. 01/04/14   Amber Renelda Loma, PA-C  ferrous sulfate 325 (65 FE) MG tablet Take 1 tablet (325 mg total) by mouth 3 (three) times daily after meals. 01/04/14   Amber Renelda Loma, PA-C  rivaroxaban (XARELTO) 10 MG TABS tablet Take 1 tablet (10 mg total) by mouth daily with breakfast. 01/04/14   Amber Lauren Constable, PA-C   BP 177/83 mmHg  Pulse 76  Temp(Src) 98.3 F (36.8 C) (Oral)  Resp 24  Ht 5' 9.5" (1.765 m)  Wt 220 lb (99.791 kg)  BMI 32.03  kg/m2  SpO2 100% Physical Exam  Constitutional: She appears well-developed and well-nourished. No distress.  HENT:  Head: Normocephalic and atraumatic.  Mouth/Throat: Oropharynx is clear and moist. No oropharyngeal exudate.  Eyes: Conjunctivae are normal.  Neck: Neck supple. No thyromegaly present.  Cardiovascular: Normal rate, regular rhythm and intact distal pulses.   Pulmonary/Chest: Effort normal and breath sounds normal. No respiratory distress. She has no wheezes. She has no rales. She exhibits no tenderness.  Abdominal: Soft. There is no tenderness.  Musculoskeletal: She exhibits tenderness.       Arms: Lymphadenopathy:    She has no cervical adenopathy.  Neurological: She is alert.  Skin: Skin is warm and dry. No rash noted. She is not diaphoretic.  Psychiatric: She has a normal mood and affect.  Nursing note and vitals reviewed.   ED Course  Procedures (including critical care time) Labs Review Labs Reviewed - No data to display  Imaging Review No results found.   EKG Interpretation None      MDM   Final diagnoses:  Muscle strain   64 yo with reproducible muscle pain with no report of chest pain, shortness of breath or neuro deficit. Discussed case with Dr. Canary Brim. Pt is well-appearing, in no acute distress and vital signs reviewed and not concerning. Pain managed in the ED.  Prescription for pain meds and muscle relaxer. She appears safe to be discharged.  Discharge include follow-up with her orthopedic doctor.  Return precautions provided.     Filed Vitals:   11/19/14 2326 11/20/14 0034  BP: 177/83 158/89  Pulse: 76 61  Temp: 98.3 F (36.8 C)   TempSrc: Oral   Resp: 24 16  Height: 5' 9.5" (1.765 m)   Weight: 220 lb (99.791 kg)   SpO2: 100% 97%   Meds given in ED:  Medications  diazepam (VALIUM) tablet 5 mg (5 mg Oral Given 11/19/14 2348)  HYDROcodone-acetaminophen (NORCO/VICODIN) 5-325 MG per tablet 1 tablet (1 tablet Oral Given 11/19/14 2348)     Discharge Medication List as of 11/20/2014 12:28 AM    START taking these medications   Details  !! HYDROcodone-acetaminophen (NORCO/VICODIN) 5-325 MG per tablet Take 2 tablets by mouth every 4 (four) hours as needed for moderate pain or severe pain., Starting 11/20/2014, Until Discontinued, Print    !! methocarbamol (ROBAXIN) 500 MG tablet Take 1 tablet (500 mg total) by mouth 2 (two) times daily., Starting 11/20/2014, Until Discontinued, Print     !! - Potential duplicate medications found. Please discuss with provider.         Britt Bottom, NP 11/20/14 [place], MD 11/22/14 1501

## 2014-11-19 NOTE — ED Notes (Signed)
NP at bedside.

## 2014-11-20 MED ORDER — METHOCARBAMOL 500 MG PO TABS: 500.0000 mg | ORAL_TABLET | Freq: Two times a day (BID) | ORAL | Status: DC

## 2014-11-20 MED ORDER — HYDROCODONE-ACETAMINOPHEN 5-325 MG PO TABS: 2.0000 | ORAL_TABLET | ORAL | Status: DC | PRN

## 2014-11-20 NOTE — Discharge Instructions (Signed)
Please follow the directions provided.  Be sure to follow-up with your orthopedic doctor to ensure you are getting better.  Take the vicodin 1 tablet every 4 hours as needed.  Take the robaxin twice a day as needed for muscle spasms.  You use ice or heat on you back and arm as needed for comfort.  Don't hesitate to return for any new, worsening or concerning symptoms.     SEEK MEDICAL CARE IF:  You have increasing pain or swelling in the injured area.  You have numbness, tingling, or a significant loss of strength in the injured area.

## 2015-04-07 IMAGING — CR DG CHEST 2V
2 series · 2 of 2 positions shown · non-contrast
Comparison: DG CHEST 2V dated 08/05/2012

CLINICAL DATA: Preoperative for right total hip joint replacement.
History of hypertension.

EXAM:
CHEST  2 VIEW

[w chest pa]
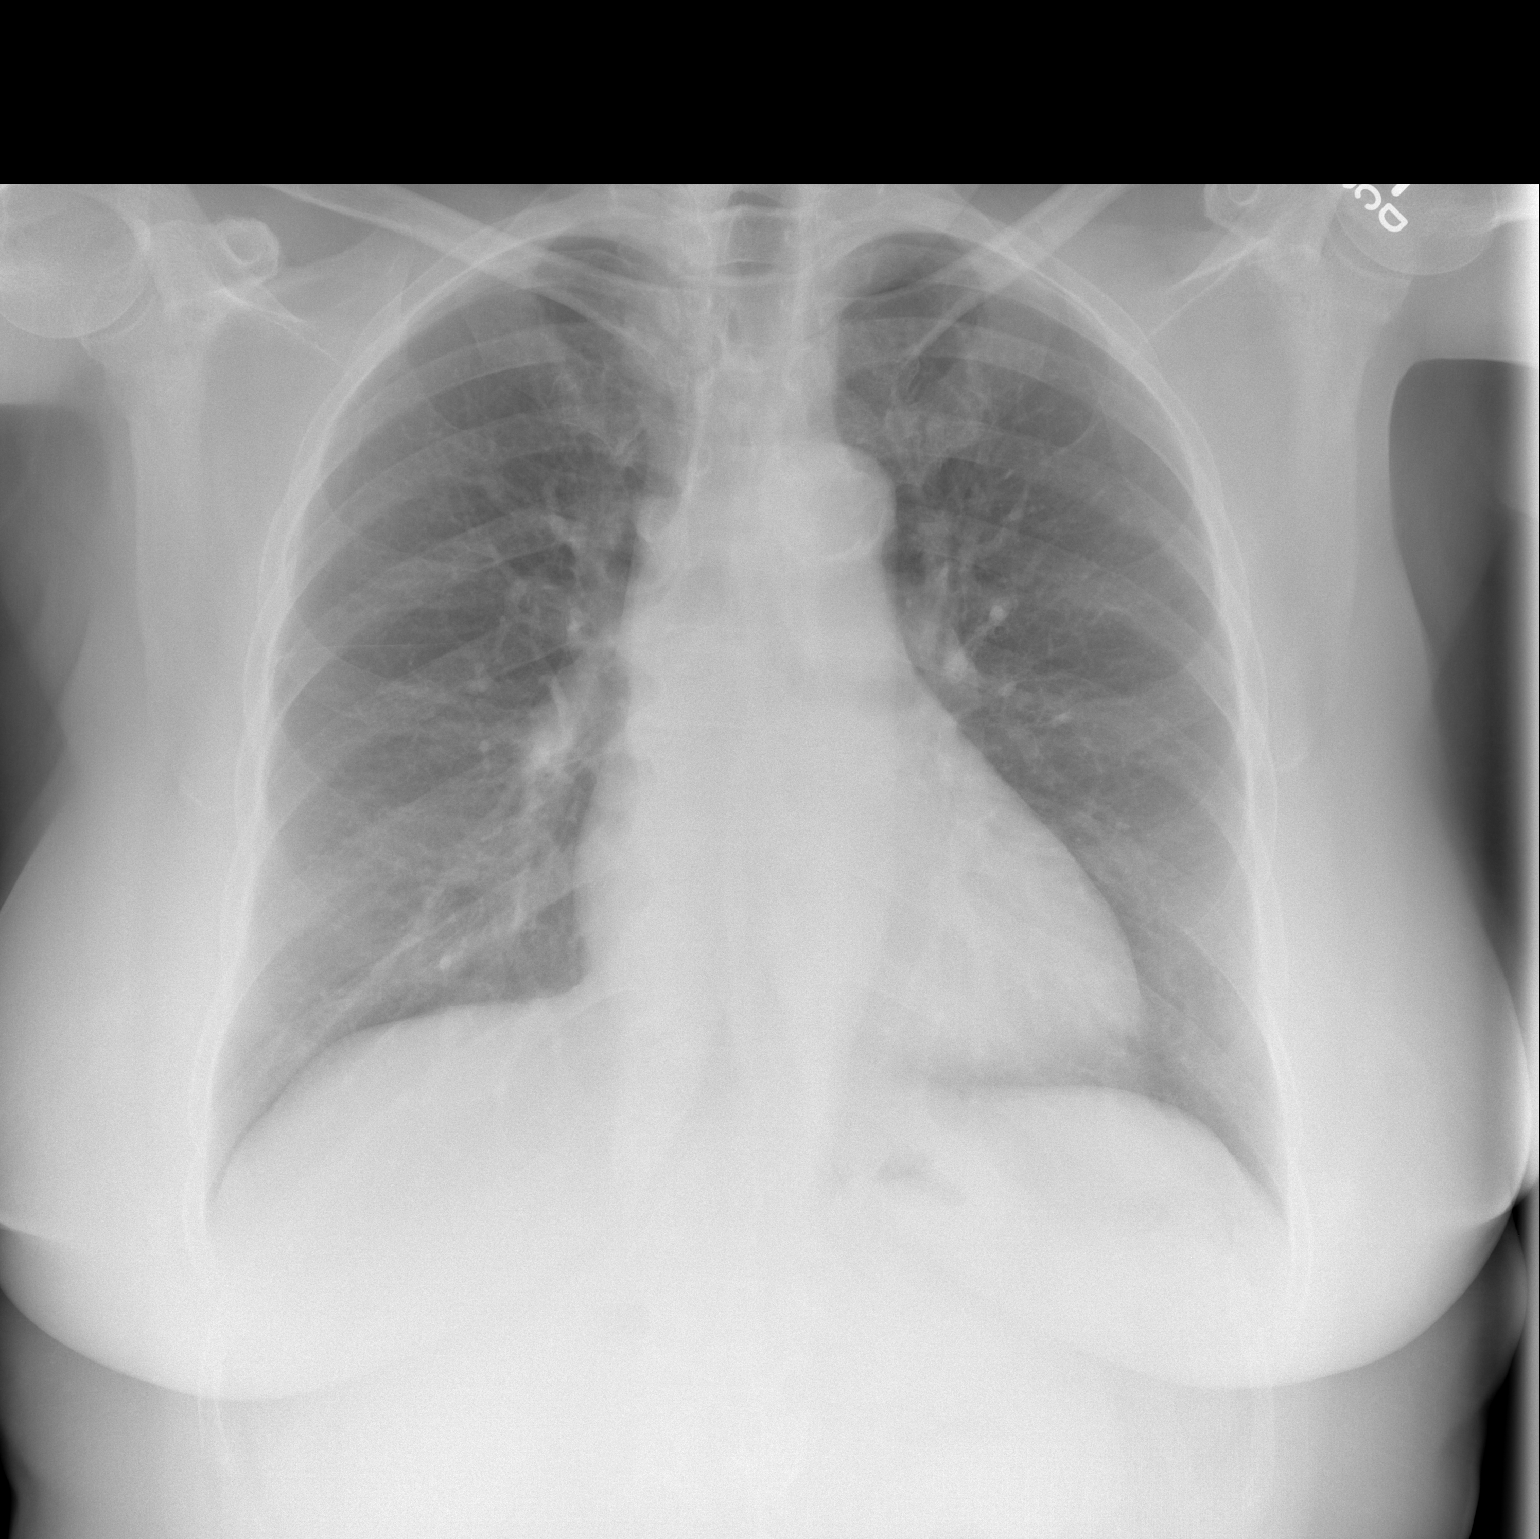

[w chest lat]
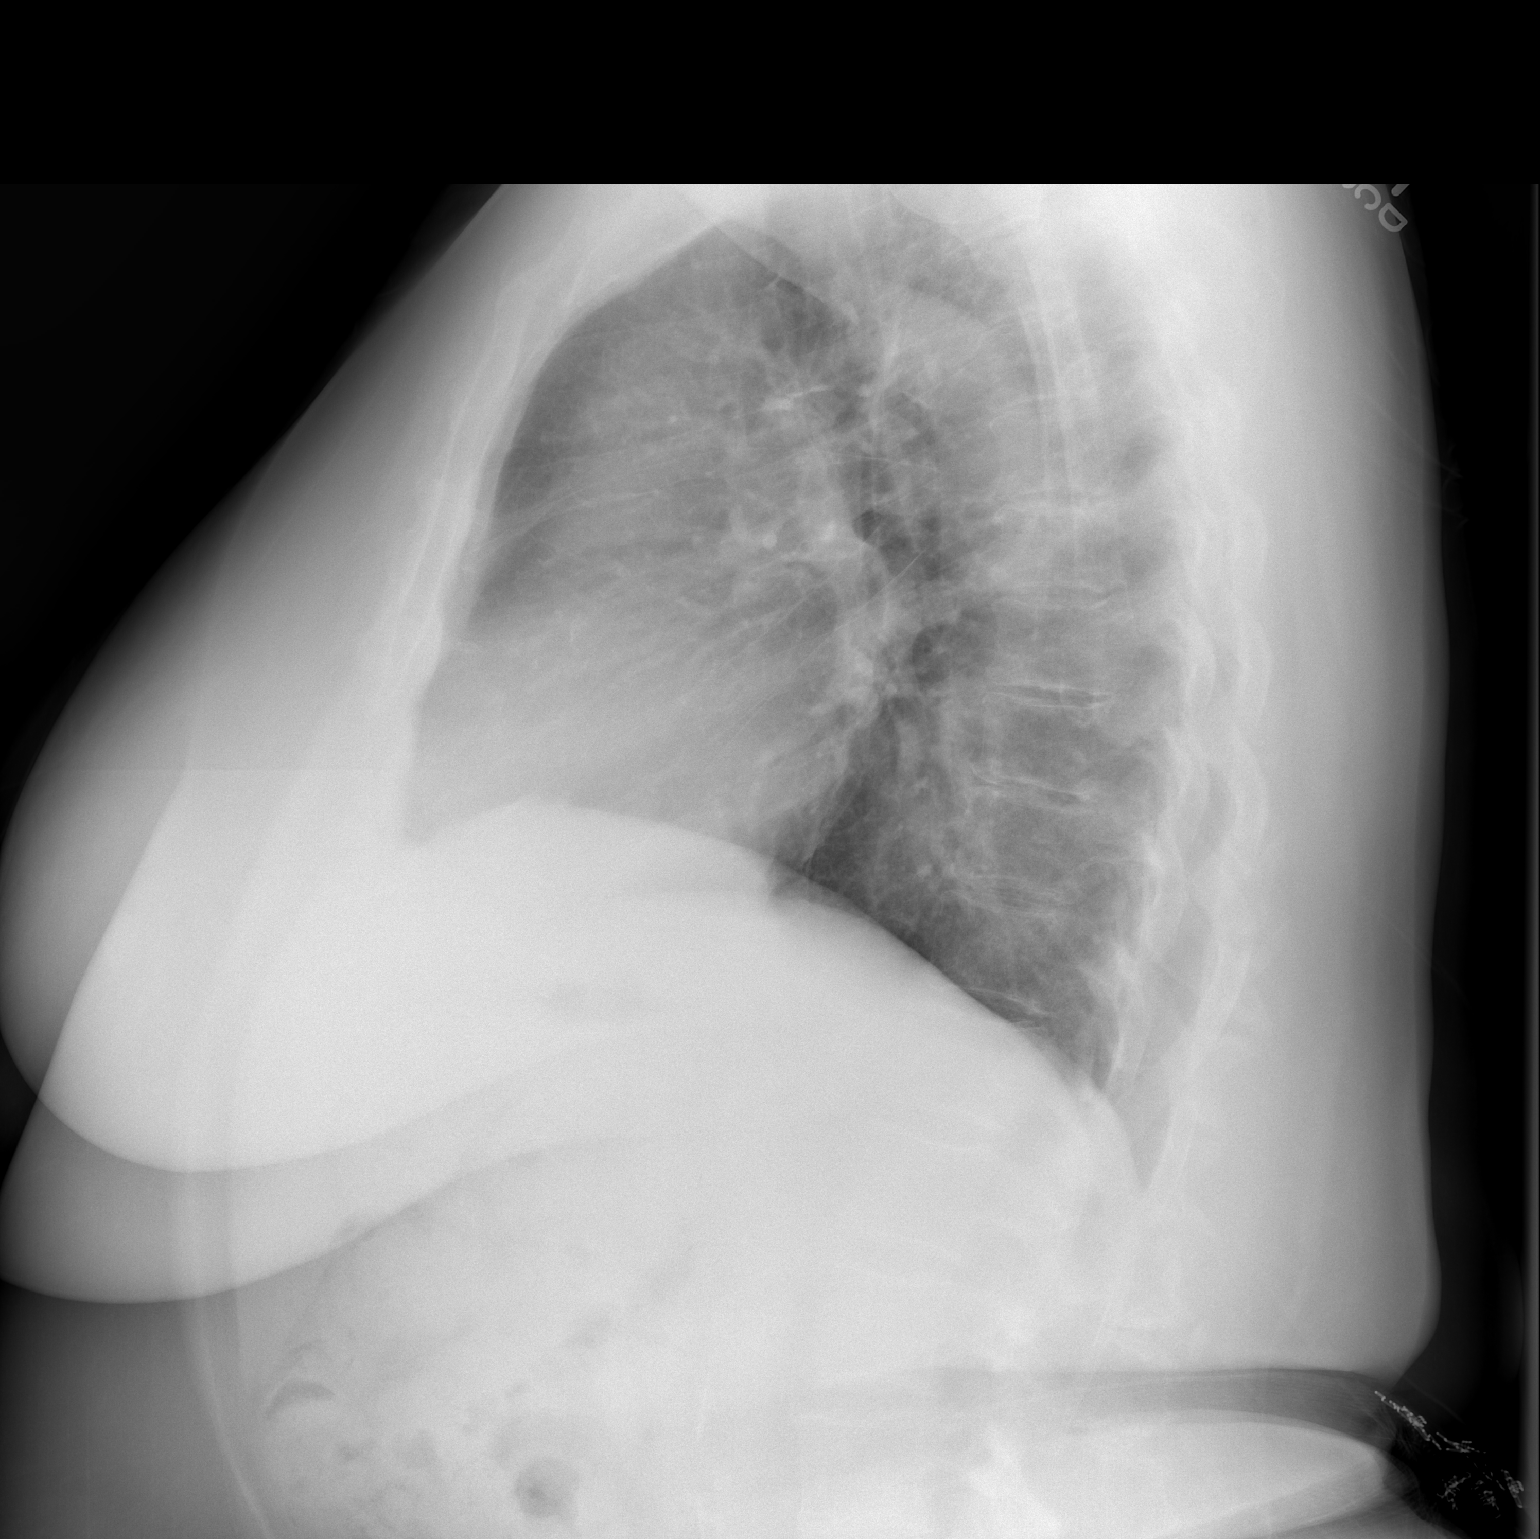

[2 of 2 positions shown; findings below may reference images not displayed]

FINDINGS: The lungs are adequately inflated. There is no focal infiltrate.
There is no pleural effusion or pneumothorax. The cardiac silhouette
is top normal in size but stable. The pulmonary vascularity is not
engorged. The mediastinum is normal in width. The observed portions
of the bony thorax appear normal.
IMPRESSION: There is no evidence of active cardiopulmonary disease.

## 2015-04-13 IMAGING — CR DG HIP 1V PORT*R*
1 series · 1 of 1 positions shown · non-contrast
Comparison: None.

CLINICAL DATA: Postop

EXAM:
PORTABLE RIGHT HIP - 1 VIEW

[AP]
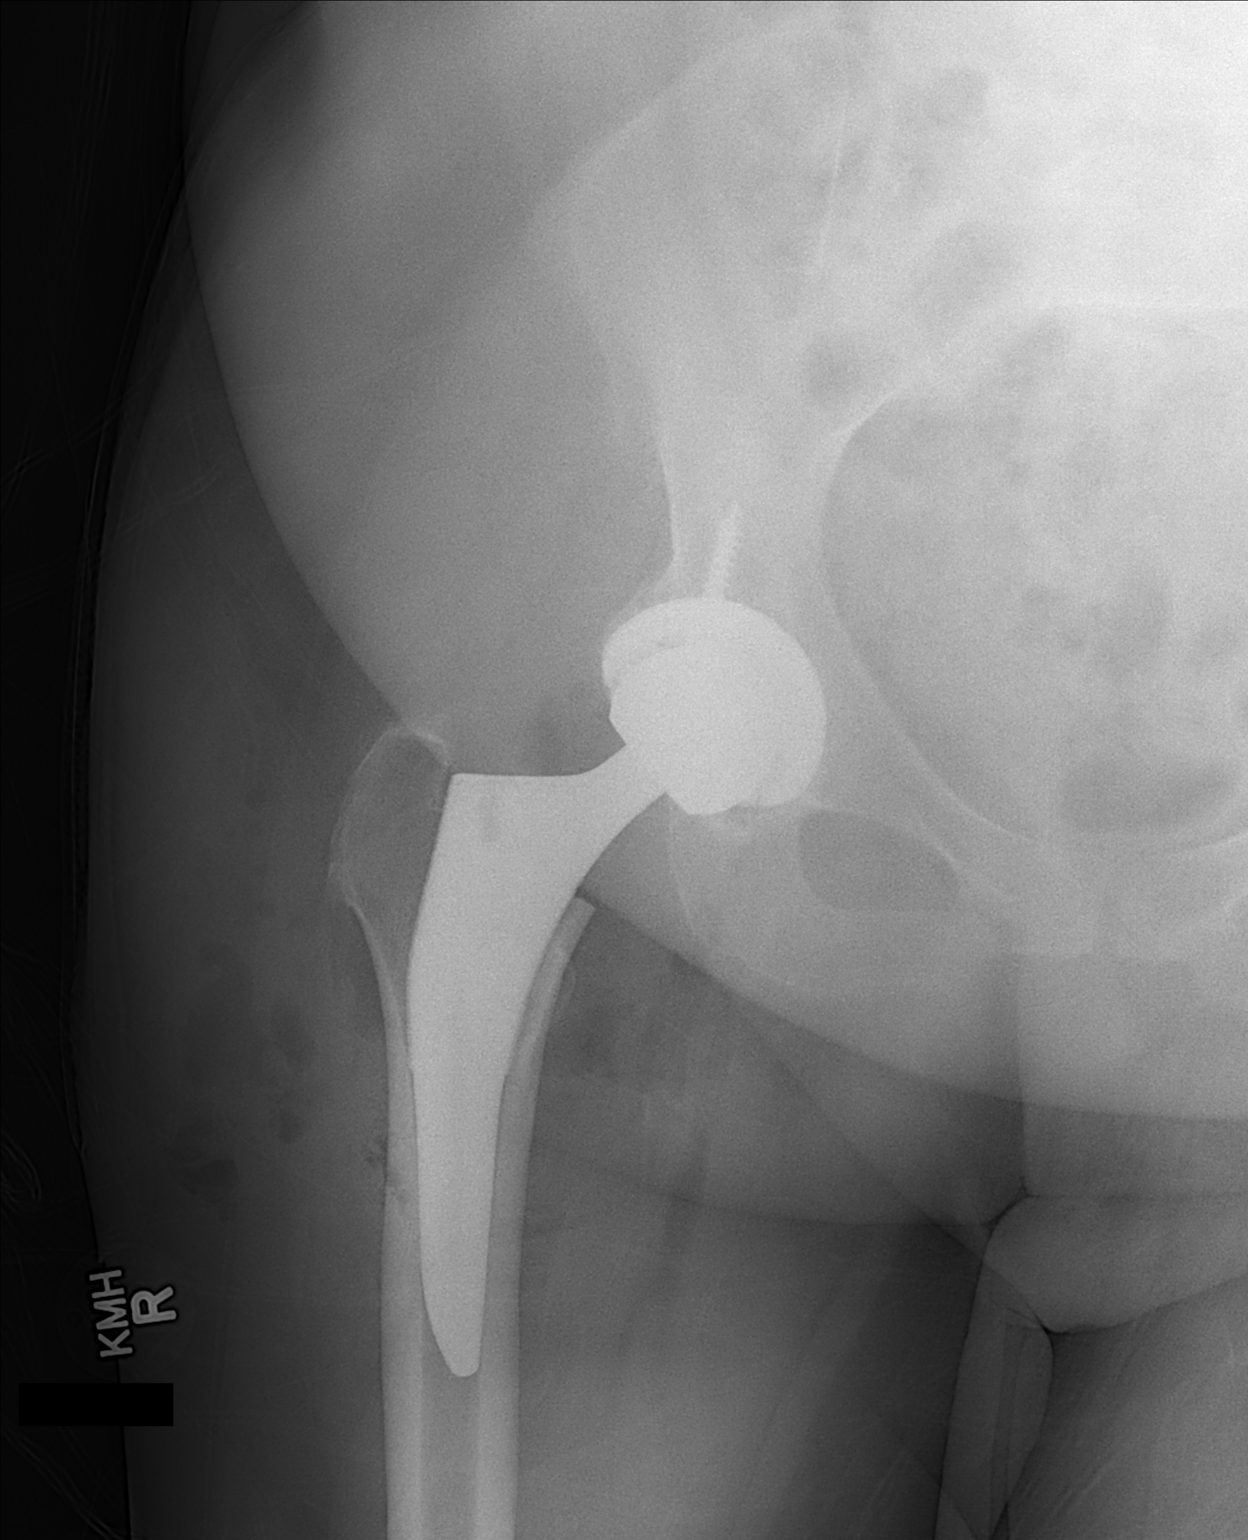

[1 of 1 positions shown; findings below may reference images not displayed]

FINDINGS: Total right hip arthroplasty. The femoral component and acetabular
component appear well seated.
IMPRESSION: No complication following right hip arthroplasty

## 2015-11-04 DIAGNOSIS — E0789 Other specified disorders of thyroid: Secondary | ICD-10-CM | POA: Diagnosis not present

## 2015-11-04 DIAGNOSIS — I10 Essential (primary) hypertension: Secondary | ICD-10-CM | POA: Diagnosis not present

## 2015-11-04 DIAGNOSIS — E118 Type 2 diabetes mellitus with unspecified complications: Secondary | ICD-10-CM | POA: Diagnosis not present

## 2015-11-25 DIAGNOSIS — N200 Calculus of kidney: Secondary | ICD-10-CM | POA: Diagnosis not present

## 2015-11-25 DIAGNOSIS — K5900 Constipation, unspecified: Secondary | ICD-10-CM | POA: Diagnosis not present

## 2015-11-25 DIAGNOSIS — E118 Type 2 diabetes mellitus with unspecified complications: Secondary | ICD-10-CM | POA: Diagnosis not present

## 2016-02-03 DIAGNOSIS — E789 Disorder of lipoprotein metabolism, unspecified: Secondary | ICD-10-CM | POA: Diagnosis not present

## 2016-02-03 DIAGNOSIS — E790 Hyperuricemia without signs of inflammatory arthritis and tophaceous disease: Secondary | ICD-10-CM | POA: Diagnosis not present

## 2016-02-03 DIAGNOSIS — E118 Type 2 diabetes mellitus with unspecified complications: Secondary | ICD-10-CM | POA: Diagnosis not present

## 2016-02-03 DIAGNOSIS — M25571 Pain in right ankle and joints of right foot: Secondary | ICD-10-CM | POA: Diagnosis not present

## 2016-03-16 DIAGNOSIS — E118 Type 2 diabetes mellitus with unspecified complications: Secondary | ICD-10-CM | POA: Diagnosis not present

## 2016-03-16 DIAGNOSIS — E785 Hyperlipidemia, unspecified: Secondary | ICD-10-CM | POA: Diagnosis not present

## 2016-03-24 DIAGNOSIS — I10 Essential (primary) hypertension: Secondary | ICD-10-CM | POA: Diagnosis not present

## 2016-03-24 DIAGNOSIS — E118 Type 2 diabetes mellitus with unspecified complications: Secondary | ICD-10-CM | POA: Diagnosis not present

## 2016-03-24 DIAGNOSIS — G629 Polyneuropathy, unspecified: Secondary | ICD-10-CM | POA: Diagnosis not present

## 2016-03-24 DIAGNOSIS — E789 Disorder of lipoprotein metabolism, unspecified: Secondary | ICD-10-CM | POA: Diagnosis not present

## 2016-05-11 DIAGNOSIS — K006 Disturbances in tooth eruption: Secondary | ICD-10-CM | POA: Diagnosis not present

## 2016-05-11 DIAGNOSIS — R69 Illness, unspecified: Secondary | ICD-10-CM | POA: Diagnosis not present

## 2016-05-22 DIAGNOSIS — R69 Illness, unspecified: Secondary | ICD-10-CM | POA: Diagnosis not present

## 2016-05-26 ENCOUNTER — Ambulatory Visit (INDEPENDENT_AMBULATORY_CARE_PROVIDER_SITE_OTHER): Payer: Commercial Managed Care - HMO | Admitting: Neurology

## 2016-05-26 ENCOUNTER — Ambulatory Visit (INDEPENDENT_AMBULATORY_CARE_PROVIDER_SITE_OTHER): Payer: Self-pay | Admitting: Neurology

## 2016-05-26 DIAGNOSIS — G5601 Carpal tunnel syndrome, right upper limb: Secondary | ICD-10-CM | POA: Diagnosis not present

## 2016-05-26 DIAGNOSIS — G5603 Carpal tunnel syndrome, bilateral upper limbs: Secondary | ICD-10-CM

## 2016-05-26 DIAGNOSIS — Z0289 Encounter for other administrative examinations: Secondary | ICD-10-CM

## 2016-05-26 DIAGNOSIS — G5602 Carpal tunnel syndrome, left upper limb: Secondary | ICD-10-CM | POA: Diagnosis not present

## 2016-05-26 NOTE — Progress Notes (Addendum)
  [facility]    Provider:  Dr Jaynee Eagles Referring Provider: Anda Kraft, MD Primary Care Physician:  Dwan Bolt, MD  History:  Jean Hamilton is a 65 y.o. female here as a referral from Dr. Wilson Singer for Carpal Tunnel Syndrome. She has numbness and tingling in the fingers for a year mostly in the tips of digits 3-4. Worse at night with nocturnal awakenings. No significant neck pain or radicular symptoms.   Summary:   Nerve Conduction Studies were performed on the bilateral upper extremities.  The right median APB motor nerve showed prolonged distal onset latency (4.4 ms, N<4.0). The right Median 2nd Digit sensory nerve showed prolonged distal peak latency (4.4 ms, N<3.9). F Wave studies indicate that the right Median F wave has normal latency  The left median APB motor nerve showed prolonged distal onset latency (4.3 ms, N<4.0). The left Median 2nd Digit sensory nerve showed prolonged distal peak latency (4.5 ms, N<3.9).  F Wave studies indicate that the left Median F wave has normal latency.  Bilateral Ulnar ADM motor nerves were within normal limits. F Wave studies indicate that the bilateral Ulnar F waves have normal latencies The bilateralUlnar 5th digit sensory nerves were within normal limits.  EMG needle study of selected bilateral upper extremity muscles was  performed. The following muscles were normal bilaterally: Deltoid, Triceps, Pronator Teres, Opponens Pollicis, First Dorsal Interosseous and Extensor Indicis.   Conclusion: There is electrophysiologic evidence of bilateral moderately-severe Carpal Tunnel Syndrome. No suggestion of polyneuropathy or radiculopathy.   Cc:Dr. Ronnette Hila, MD  [facility] [address] [place],  [address]  Phone [phone] Fax [phone]

## 2016-05-26 NOTE — Addendum Note (Signed)
Addended by: Sarina Ill B on: 05/26/2016 09:35 AM   Modules accepted: Orders

## 2016-05-26 NOTE — Progress Notes (Signed)
See procedure note.

## 2016-05-26 NOTE — Procedures (Signed)
[facility]    Provider:  Dr Jaynee Eagles Referring Provider: Anda Kraft, MD Primary Care Physician:  Dwan Bolt, MD  History:  Jean Hamilton is a 65 y.o. female here as a referral from Dr. Wilson Singer for Carpal Tunnel Syndrome. She has numbness and tingling in the fingers for a year mostly in the tips of digits 3-4. Worse at night with nocturnal awakenings. No significant neck pain or radicular symptoms.   Summary:   Nerve Conduction Studies were performed on the bilateral upper extremities.  The right median APB motor nerve showed prolonged distal onset latency (4.4 ms, N<4.0). The right Median 2nd Digit sensory nerve showed prolonged distal peak latency (4.4 ms, N<3.9). F Wave studies indicate that the right Median F wave has normal latency  The left median APB motor nerve showed prolonged distal onset latency (4.3 ms, N<4.0). The left Median 2nd Digit sensory nerve showed prolonged distal peak latency (4.5 ms, N<3.9).  F Wave studies indicate that the left Median F wave has normal latency.  Bilateral Ulnar ADM motor nerves were within normal limits. F Wave studies indicate that the bilateral Ulnar F waves have normal latencies The bilateralUlnar 5th digit sensory nerves were within normal limits.  EMG needle study of selected bilateral upper extremity muscles was  performed. The following muscles were normal bilaterally: Deltoid, Triceps, Pronator Teres, Opponens Pollicis, First Dorsal Interosseous and Extensor Indicis.   Conclusion: There is electrophysiologic evidence of bilateral moderately-severe Carpal Tunnel Syndrome. No suggestion of polyneuropathy or radiculopathy.   Cc:Dr. Ronnette Hila, MD  [facility] [address] [place], [place] [address]  Phone [phone] Fax [phone]

## 2016-06-03 DIAGNOSIS — R69 Illness, unspecified: Secondary | ICD-10-CM | POA: Diagnosis not present

## 2016-07-16 DIAGNOSIS — E0789 Other specified disorders of thyroid: Secondary | ICD-10-CM | POA: Diagnosis not present

## 2016-07-16 DIAGNOSIS — I10 Essential (primary) hypertension: Secondary | ICD-10-CM | POA: Diagnosis not present

## 2016-07-16 DIAGNOSIS — E118 Type 2 diabetes mellitus with unspecified complications: Secondary | ICD-10-CM | POA: Diagnosis not present

## 2016-07-16 DIAGNOSIS — Z79899 Other long term (current) drug therapy: Secondary | ICD-10-CM | POA: Diagnosis not present

## 2016-07-16 DIAGNOSIS — Z0000 Encounter for general adult medical examination without abnormal findings: Secondary | ICD-10-CM | POA: Diagnosis not present

## 2016-07-16 DIAGNOSIS — Z23 Encounter for immunization: Secondary | ICD-10-CM | POA: Diagnosis not present

## 2016-07-16 DIAGNOSIS — E785 Hyperlipidemia, unspecified: Secondary | ICD-10-CM | POA: Diagnosis not present

## 2016-07-16 DIAGNOSIS — E119 Type 2 diabetes mellitus without complications: Secondary | ICD-10-CM | POA: Diagnosis not present

## 2016-07-23 DIAGNOSIS — I10 Essential (primary) hypertension: Secondary | ICD-10-CM | POA: Diagnosis not present

## 2016-07-23 DIAGNOSIS — M1612 Unilateral primary osteoarthritis, left hip: Secondary | ICD-10-CM | POA: Diagnosis not present

## 2016-07-23 DIAGNOSIS — E032 Hypothyroidism due to medicaments and other exogenous substances: Secondary | ICD-10-CM | POA: Diagnosis not present

## 2016-07-23 DIAGNOSIS — E789 Disorder of lipoprotein metabolism, unspecified: Secondary | ICD-10-CM | POA: Diagnosis not present

## 2016-07-23 DIAGNOSIS — M549 Dorsalgia, unspecified: Secondary | ICD-10-CM | POA: Diagnosis not present

## 2016-08-11 DIAGNOSIS — Z1231 Encounter for screening mammogram for malignant neoplasm of breast: Secondary | ICD-10-CM | POA: Diagnosis not present

## 2016-08-17 DIAGNOSIS — H5203 Hypermetropia, bilateral: Secondary | ICD-10-CM | POA: Diagnosis not present

## 2016-08-17 DIAGNOSIS — E119 Type 2 diabetes mellitus without complications: Secondary | ICD-10-CM | POA: Diagnosis not present

## 2016-08-17 DIAGNOSIS — H52223 Regular astigmatism, bilateral: Secondary | ICD-10-CM | POA: Diagnosis not present

## 2016-08-17 DIAGNOSIS — H04123 Dry eye syndrome of bilateral lacrimal glands: Secondary | ICD-10-CM | POA: Diagnosis not present

## 2016-08-17 DIAGNOSIS — I10 Essential (primary) hypertension: Secondary | ICD-10-CM | POA: Diagnosis not present

## 2016-08-17 DIAGNOSIS — E7800 Pure hypercholesterolemia, unspecified: Secondary | ICD-10-CM | POA: Diagnosis not present

## 2016-08-20 DIAGNOSIS — M545 Low back pain: Secondary | ICD-10-CM | POA: Diagnosis not present

## 2016-09-01 DIAGNOSIS — M545 Low back pain: Secondary | ICD-10-CM | POA: Diagnosis not present

## 2016-09-01 DIAGNOSIS — G8929 Other chronic pain: Secondary | ICD-10-CM | POA: Diagnosis not present

## 2016-09-14 DIAGNOSIS — G8929 Other chronic pain: Secondary | ICD-10-CM | POA: Diagnosis not present

## 2016-09-14 DIAGNOSIS — M545 Low back pain: Secondary | ICD-10-CM | POA: Diagnosis not present

## 2016-09-16 DIAGNOSIS — H04123 Dry eye syndrome of bilateral lacrimal glands: Secondary | ICD-10-CM | POA: Diagnosis not present

## 2016-09-22 DIAGNOSIS — M5442 Lumbago with sciatica, left side: Secondary | ICD-10-CM | POA: Diagnosis not present

## 2016-09-22 DIAGNOSIS — G8929 Other chronic pain: Secondary | ICD-10-CM | POA: Diagnosis not present

## 2016-09-25 DIAGNOSIS — M5442 Lumbago with sciatica, left side: Secondary | ICD-10-CM | POA: Diagnosis not present

## 2016-09-25 DIAGNOSIS — G8929 Other chronic pain: Secondary | ICD-10-CM | POA: Diagnosis not present

## 2016-09-29 DIAGNOSIS — G8929 Other chronic pain: Secondary | ICD-10-CM | POA: Diagnosis not present

## 2016-09-29 DIAGNOSIS — M5442 Lumbago with sciatica, left side: Secondary | ICD-10-CM | POA: Diagnosis not present

## 2016-10-01 DIAGNOSIS — M5442 Lumbago with sciatica, left side: Secondary | ICD-10-CM | POA: Diagnosis not present

## 2016-10-01 DIAGNOSIS — G8929 Other chronic pain: Secondary | ICD-10-CM | POA: Diagnosis not present

## 2016-10-07 DIAGNOSIS — G8929 Other chronic pain: Secondary | ICD-10-CM | POA: Diagnosis not present

## 2016-10-07 DIAGNOSIS — M5442 Lumbago with sciatica, left side: Secondary | ICD-10-CM | POA: Diagnosis not present

## 2016-10-09 DIAGNOSIS — G8929 Other chronic pain: Secondary | ICD-10-CM | POA: Diagnosis not present

## 2016-10-09 DIAGNOSIS — M5442 Lumbago with sciatica, left side: Secondary | ICD-10-CM | POA: Diagnosis not present

## 2016-10-13 DIAGNOSIS — G8929 Other chronic pain: Secondary | ICD-10-CM | POA: Diagnosis not present

## 2016-10-13 DIAGNOSIS — M545 Low back pain: Secondary | ICD-10-CM | POA: Diagnosis not present

## 2016-12-15 DIAGNOSIS — R069 Unspecified abnormalities of breathing: Secondary | ICD-10-CM | POA: Diagnosis not present

## 2016-12-15 DIAGNOSIS — R0602 Shortness of breath: Secondary | ICD-10-CM | POA: Diagnosis not present

## 2016-12-15 DIAGNOSIS — R609 Edema, unspecified: Secondary | ICD-10-CM | POA: Diagnosis not present

## 2016-12-15 DIAGNOSIS — R0689 Other abnormalities of breathing: Secondary | ICD-10-CM | POA: Diagnosis not present

## 2016-12-15 DIAGNOSIS — Z1321 Encounter for screening for nutritional disorder: Secondary | ICD-10-CM | POA: Diagnosis not present

## 2016-12-15 DIAGNOSIS — I10 Essential (primary) hypertension: Secondary | ICD-10-CM | POA: Diagnosis not present

## 2016-12-15 DIAGNOSIS — E118 Type 2 diabetes mellitus with unspecified complications: Secondary | ICD-10-CM | POA: Diagnosis not present

## 2016-12-16 ENCOUNTER — Other Ambulatory Visit: Payer: Self-pay | Admitting: Endocrinology

## 2016-12-16 DIAGNOSIS — R609 Edema, unspecified: Secondary | ICD-10-CM

## 2017-01-01 ENCOUNTER — Other Ambulatory Visit (HOSPITAL_COMMUNITY): Payer: Managed Care, Other (non HMO)

## 2017-01-04 ENCOUNTER — Other Ambulatory Visit: Payer: Self-pay

## 2017-01-04 ENCOUNTER — Ambulatory Visit (HOSPITAL_COMMUNITY): Payer: Medicare HMO | Attending: Cardiology

## 2017-01-04 DIAGNOSIS — R609 Edema, unspecified: Secondary | ICD-10-CM

## 2017-01-04 DIAGNOSIS — I517 Cardiomegaly: Secondary | ICD-10-CM | POA: Insufficient documentation

## 2017-01-04 LAB — ECHOCARDIOGRAM COMPLETE
CHL CUP MV DEC (S): 268
E decel time: 268 msec
E/e' ratio: 9.69
FS: 34 % (ref 28–44)
IVS/LV PW RATIO, ED: 1.21
LA ID, A-P, ES: 35 mm
LA vol index: 16.5 mL/m2
LADIAMINDEX: 1.56 cm/m2
LAVOL: 37.1 mL
LAVOLA4C: 38.9 mL
LDCA: 3.8 cm2
LEFT ATRIUM END SYS DIAM: 35 mm
LV E/e'average: 9.69
LV TDI E'LATERAL: 8.16
LV e' LATERAL: 8.16 cm/s
LVEEMED: 9.69
LVOTD: 22 mm
MV Peak grad: 3 mmHg
MV pk E vel: 79.1 m/s
MVPKAVEL: 114 m/s
PW: 15.7 mm — AB (ref 0.6–1.1)
TDI e' medial: 7.72

## 2017-01-05 DIAGNOSIS — I10 Essential (primary) hypertension: Secondary | ICD-10-CM | POA: Diagnosis not present

## 2017-01-05 DIAGNOSIS — E789 Disorder of lipoprotein metabolism, unspecified: Secondary | ICD-10-CM | POA: Diagnosis not present

## 2017-01-05 DIAGNOSIS — E118 Type 2 diabetes mellitus with unspecified complications: Secondary | ICD-10-CM | POA: Diagnosis not present

## 2017-01-05 DIAGNOSIS — M549 Dorsalgia, unspecified: Secondary | ICD-10-CM | POA: Diagnosis not present

## 2017-01-22 DIAGNOSIS — G4733 Obstructive sleep apnea (adult) (pediatric): Secondary | ICD-10-CM | POA: Diagnosis not present

## 2017-01-22 DIAGNOSIS — M545 Low back pain: Secondary | ICD-10-CM | POA: Diagnosis not present

## 2017-01-22 DIAGNOSIS — G4750 Parasomnia, unspecified: Secondary | ICD-10-CM | POA: Diagnosis not present

## 2017-01-22 DIAGNOSIS — R0602 Shortness of breath: Secondary | ICD-10-CM | POA: Diagnosis not present

## 2017-01-22 DIAGNOSIS — G2581 Restless legs syndrome: Secondary | ICD-10-CM | POA: Diagnosis not present

## 2017-02-16 DIAGNOSIS — I10 Essential (primary) hypertension: Secondary | ICD-10-CM | POA: Diagnosis not present

## 2017-02-16 DIAGNOSIS — M25551 Pain in right hip: Secondary | ICD-10-CM | POA: Diagnosis not present

## 2017-02-16 DIAGNOSIS — M5136 Other intervertebral disc degeneration, lumbar region: Secondary | ICD-10-CM | POA: Diagnosis not present

## 2017-02-16 DIAGNOSIS — M545 Low back pain: Secondary | ICD-10-CM | POA: Diagnosis not present

## 2017-02-24 DIAGNOSIS — M5136 Other intervertebral disc degeneration, lumbar region: Secondary | ICD-10-CM | POA: Diagnosis not present

## 2017-03-02 DIAGNOSIS — M4310 Spondylolisthesis, site unspecified: Secondary | ICD-10-CM | POA: Diagnosis not present

## 2017-03-02 DIAGNOSIS — M5440 Lumbago with sciatica, unspecified side: Secondary | ICD-10-CM | POA: Diagnosis not present

## 2017-03-02 DIAGNOSIS — M5136 Other intervertebral disc degeneration, lumbar region: Secondary | ICD-10-CM | POA: Diagnosis not present

## 2017-03-02 DIAGNOSIS — N951 Menopausal and female climacteric states: Secondary | ICD-10-CM | POA: Diagnosis not present

## 2017-03-02 DIAGNOSIS — I10 Essential (primary) hypertension: Secondary | ICD-10-CM | POA: Diagnosis not present

## 2017-03-04 DIAGNOSIS — R0689 Other abnormalities of breathing: Secondary | ICD-10-CM | POA: Diagnosis not present

## 2017-03-04 DIAGNOSIS — I10 Essential (primary) hypertension: Secondary | ICD-10-CM | POA: Diagnosis not present

## 2017-03-04 DIAGNOSIS — E118 Type 2 diabetes mellitus with unspecified complications: Secondary | ICD-10-CM | POA: Diagnosis not present

## 2017-03-04 DIAGNOSIS — R609 Edema, unspecified: Secondary | ICD-10-CM | POA: Diagnosis not present

## 2017-03-11 DIAGNOSIS — R05 Cough: Secondary | ICD-10-CM | POA: Diagnosis not present

## 2017-03-11 DIAGNOSIS — I10 Essential (primary) hypertension: Secondary | ICD-10-CM | POA: Diagnosis not present

## 2017-03-11 DIAGNOSIS — E118 Type 2 diabetes mellitus with unspecified complications: Secondary | ICD-10-CM | POA: Diagnosis not present

## 2017-03-29 DIAGNOSIS — R0902 Hypoxemia: Secondary | ICD-10-CM | POA: Diagnosis not present

## 2017-03-29 DIAGNOSIS — G4733 Obstructive sleep apnea (adult) (pediatric): Secondary | ICD-10-CM | POA: Diagnosis not present

## 2017-04-02 DIAGNOSIS — M4310 Spondylolisthesis, site unspecified: Secondary | ICD-10-CM | POA: Diagnosis not present

## 2017-04-02 DIAGNOSIS — Z79899 Other long term (current) drug therapy: Secondary | ICD-10-CM | POA: Diagnosis not present

## 2017-04-02 DIAGNOSIS — M545 Low back pain: Secondary | ICD-10-CM | POA: Diagnosis not present

## 2017-04-02 DIAGNOSIS — M5136 Other intervertebral disc degeneration, lumbar region: Secondary | ICD-10-CM | POA: Diagnosis not present

## 2017-04-02 DIAGNOSIS — I10 Essential (primary) hypertension: Secondary | ICD-10-CM | POA: Diagnosis not present

## 2017-04-05 DIAGNOSIS — I10 Essential (primary) hypertension: Secondary | ICD-10-CM | POA: Diagnosis not present

## 2017-04-05 DIAGNOSIS — E118 Type 2 diabetes mellitus with unspecified complications: Secondary | ICD-10-CM | POA: Diagnosis not present

## 2017-04-12 DIAGNOSIS — M5136 Other intervertebral disc degeneration, lumbar region: Secondary | ICD-10-CM | POA: Diagnosis not present

## 2017-04-12 DIAGNOSIS — G4733 Obstructive sleep apnea (adult) (pediatric): Secondary | ICD-10-CM | POA: Diagnosis not present

## 2017-04-12 DIAGNOSIS — I10 Essential (primary) hypertension: Secondary | ICD-10-CM | POA: Diagnosis not present

## 2017-04-12 DIAGNOSIS — K219 Gastro-esophageal reflux disease without esophagitis: Secondary | ICD-10-CM | POA: Diagnosis not present

## 2017-04-13 DIAGNOSIS — I10 Essential (primary) hypertension: Secondary | ICD-10-CM | POA: Diagnosis not present

## 2017-04-13 DIAGNOSIS — E118 Type 2 diabetes mellitus with unspecified complications: Secondary | ICD-10-CM | POA: Diagnosis not present

## 2017-04-21 DIAGNOSIS — G4733 Obstructive sleep apnea (adult) (pediatric): Secondary | ICD-10-CM | POA: Diagnosis not present

## 2017-05-10 DIAGNOSIS — Z79899 Other long term (current) drug therapy: Secondary | ICD-10-CM | POA: Diagnosis not present

## 2017-05-10 DIAGNOSIS — M5136 Other intervertebral disc degeneration, lumbar region: Secondary | ICD-10-CM | POA: Diagnosis not present

## 2017-05-10 DIAGNOSIS — I10 Essential (primary) hypertension: Secondary | ICD-10-CM | POA: Diagnosis not present

## 2017-05-10 DIAGNOSIS — M545 Low back pain: Secondary | ICD-10-CM | POA: Diagnosis not present

## 2017-05-10 DIAGNOSIS — M4310 Spondylolisthesis, site unspecified: Secondary | ICD-10-CM | POA: Diagnosis not present

## 2017-05-22 DIAGNOSIS — G4733 Obstructive sleep apnea (adult) (pediatric): Secondary | ICD-10-CM | POA: Diagnosis not present

## 2017-05-25 DIAGNOSIS — J310 Chronic rhinitis: Secondary | ICD-10-CM | POA: Diagnosis not present

## 2017-05-25 DIAGNOSIS — R739 Hyperglycemia, unspecified: Secondary | ICD-10-CM | POA: Diagnosis not present

## 2017-05-25 DIAGNOSIS — G4733 Obstructive sleep apnea (adult) (pediatric): Secondary | ICD-10-CM | POA: Diagnosis not present

## 2017-06-09 DIAGNOSIS — K5909 Other constipation: Secondary | ICD-10-CM | POA: Diagnosis not present

## 2017-06-09 DIAGNOSIS — M5136 Other intervertebral disc degeneration, lumbar region: Secondary | ICD-10-CM | POA: Diagnosis not present

## 2017-06-09 DIAGNOSIS — Z79899 Other long term (current) drug therapy: Secondary | ICD-10-CM | POA: Diagnosis not present

## 2017-06-09 DIAGNOSIS — M545 Low back pain: Secondary | ICD-10-CM | POA: Diagnosis not present

## 2017-06-09 DIAGNOSIS — R1032 Left lower quadrant pain: Secondary | ICD-10-CM | POA: Diagnosis not present

## 2017-06-21 DIAGNOSIS — R1032 Left lower quadrant pain: Secondary | ICD-10-CM | POA: Diagnosis not present

## 2017-06-22 DIAGNOSIS — G4733 Obstructive sleep apnea (adult) (pediatric): Secondary | ICD-10-CM | POA: Diagnosis not present

## 2017-07-14 DIAGNOSIS — K219 Gastro-esophageal reflux disease without esophagitis: Secondary | ICD-10-CM | POA: Diagnosis not present

## 2017-07-14 DIAGNOSIS — Z1211 Encounter for screening for malignant neoplasm of colon: Secondary | ICD-10-CM | POA: Diagnosis not present

## 2017-07-14 DIAGNOSIS — R1310 Dysphagia, unspecified: Secondary | ICD-10-CM | POA: Diagnosis not present

## 2017-07-15 DIAGNOSIS — A048 Other specified bacterial intestinal infections: Secondary | ICD-10-CM | POA: Diagnosis not present

## 2017-07-15 DIAGNOSIS — K219 Gastro-esophageal reflux disease without esophagitis: Secondary | ICD-10-CM | POA: Diagnosis not present

## 2017-07-15 DIAGNOSIS — Z1211 Encounter for screening for malignant neoplasm of colon: Secondary | ICD-10-CM | POA: Diagnosis not present

## 2017-07-15 DIAGNOSIS — Z01818 Encounter for other preprocedural examination: Secondary | ICD-10-CM | POA: Diagnosis not present

## 2017-07-16 DIAGNOSIS — Z79899 Other long term (current) drug therapy: Secondary | ICD-10-CM | POA: Diagnosis not present

## 2017-07-16 DIAGNOSIS — M545 Low back pain: Secondary | ICD-10-CM | POA: Diagnosis not present

## 2017-07-16 DIAGNOSIS — Z23 Encounter for immunization: Secondary | ICD-10-CM | POA: Diagnosis not present

## 2017-07-16 DIAGNOSIS — M5136 Other intervertebral disc degeneration, lumbar region: Secondary | ICD-10-CM | POA: Diagnosis not present

## 2017-07-16 DIAGNOSIS — M4310 Spondylolisthesis, site unspecified: Secondary | ICD-10-CM | POA: Diagnosis not present

## 2017-07-22 DIAGNOSIS — G4733 Obstructive sleep apnea (adult) (pediatric): Secondary | ICD-10-CM | POA: Diagnosis not present

## 2017-07-24 DIAGNOSIS — K200 Eosinophilic esophagitis: Secondary | ICD-10-CM | POA: Diagnosis not present

## 2017-07-25 DIAGNOSIS — K210 Gastro-esophageal reflux disease with esophagitis: Secondary | ICD-10-CM | POA: Diagnosis not present

## 2017-07-30 DIAGNOSIS — Z1211 Encounter for screening for malignant neoplasm of colon: Secondary | ICD-10-CM | POA: Diagnosis not present

## 2017-07-30 DIAGNOSIS — K219 Gastro-esophageal reflux disease without esophagitis: Secondary | ICD-10-CM | POA: Diagnosis not present

## 2017-08-13 DIAGNOSIS — M4310 Spondylolisthesis, site unspecified: Secondary | ICD-10-CM | POA: Diagnosis not present

## 2017-08-13 DIAGNOSIS — M5136 Other intervertebral disc degeneration, lumbar region: Secondary | ICD-10-CM | POA: Diagnosis not present

## 2017-08-13 DIAGNOSIS — M545 Low back pain: Secondary | ICD-10-CM | POA: Diagnosis not present

## 2017-08-13 DIAGNOSIS — Z79899 Other long term (current) drug therapy: Secondary | ICD-10-CM | POA: Diagnosis not present

## 2017-08-22 DIAGNOSIS — G4733 Obstructive sleep apnea (adult) (pediatric): Secondary | ICD-10-CM | POA: Diagnosis not present

## 2017-08-23 DIAGNOSIS — H524 Presbyopia: Secondary | ICD-10-CM | POA: Diagnosis not present

## 2017-08-23 DIAGNOSIS — I10 Essential (primary) hypertension: Secondary | ICD-10-CM | POA: Diagnosis not present

## 2017-08-23 DIAGNOSIS — E119 Type 2 diabetes mellitus without complications: Secondary | ICD-10-CM | POA: Diagnosis not present

## 2017-08-23 DIAGNOSIS — H04123 Dry eye syndrome of bilateral lacrimal glands: Secondary | ICD-10-CM | POA: Diagnosis not present

## 2017-08-25 DIAGNOSIS — M545 Low back pain: Secondary | ICD-10-CM | POA: Diagnosis not present

## 2017-08-25 DIAGNOSIS — G4733 Obstructive sleep apnea (adult) (pediatric): Secondary | ICD-10-CM | POA: Diagnosis not present

## 2017-08-25 DIAGNOSIS — K219 Gastro-esophageal reflux disease without esophagitis: Secondary | ICD-10-CM | POA: Diagnosis not present

## 2017-08-25 DIAGNOSIS — M5136 Other intervertebral disc degeneration, lumbar region: Secondary | ICD-10-CM | POA: Diagnosis not present

## 2017-09-15 DIAGNOSIS — M545 Low back pain: Secondary | ICD-10-CM | POA: Diagnosis not present

## 2017-09-15 DIAGNOSIS — R300 Dysuria: Secondary | ICD-10-CM | POA: Diagnosis not present

## 2017-09-15 DIAGNOSIS — R5383 Other fatigue: Secondary | ICD-10-CM | POA: Diagnosis not present

## 2017-09-15 DIAGNOSIS — M4310 Spondylolisthesis, site unspecified: Secondary | ICD-10-CM | POA: Diagnosis not present

## 2017-09-15 DIAGNOSIS — D539 Nutritional anemia, unspecified: Secondary | ICD-10-CM | POA: Diagnosis not present

## 2017-09-15 DIAGNOSIS — M5136 Other intervertebral disc degeneration, lumbar region: Secondary | ICD-10-CM | POA: Diagnosis not present

## 2017-09-15 DIAGNOSIS — E7800 Pure hypercholesterolemia, unspecified: Secondary | ICD-10-CM | POA: Diagnosis not present

## 2017-09-15 DIAGNOSIS — Z79899 Other long term (current) drug therapy: Secondary | ICD-10-CM | POA: Diagnosis not present

## 2017-09-15 DIAGNOSIS — E559 Vitamin D deficiency, unspecified: Secondary | ICD-10-CM | POA: Diagnosis not present

## 2017-09-15 DIAGNOSIS — M129 Arthropathy, unspecified: Secondary | ICD-10-CM | POA: Diagnosis not present

## 2017-09-21 DIAGNOSIS — G4733 Obstructive sleep apnea (adult) (pediatric): Secondary | ICD-10-CM | POA: Diagnosis not present

## 2017-09-22 DIAGNOSIS — E0580 Other thyrotoxicosis without thyrotoxic crisis or storm: Secondary | ICD-10-CM | POA: Diagnosis not present

## 2017-09-22 DIAGNOSIS — R945 Abnormal results of liver function studies: Secondary | ICD-10-CM | POA: Diagnosis not present

## 2017-09-27 DIAGNOSIS — R945 Abnormal results of liver function studies: Secondary | ICD-10-CM | POA: Diagnosis not present

## 2017-10-08 DIAGNOSIS — Z1231 Encounter for screening mammogram for malignant neoplasm of breast: Secondary | ICD-10-CM | POA: Diagnosis not present

## 2017-10-15 DIAGNOSIS — M545 Low back pain: Secondary | ICD-10-CM | POA: Diagnosis not present

## 2017-10-15 DIAGNOSIS — M5136 Other intervertebral disc degeneration, lumbar region: Secondary | ICD-10-CM | POA: Diagnosis not present

## 2017-10-15 DIAGNOSIS — Z79899 Other long term (current) drug therapy: Secondary | ICD-10-CM | POA: Diagnosis not present

## 2017-10-15 DIAGNOSIS — K760 Fatty (change of) liver, not elsewhere classified: Secondary | ICD-10-CM | POA: Diagnosis not present

## 2017-10-22 DIAGNOSIS — G4733 Obstructive sleep apnea (adult) (pediatric): Secondary | ICD-10-CM | POA: Diagnosis not present

## 2017-11-15 DIAGNOSIS — E119 Type 2 diabetes mellitus without complications: Secondary | ICD-10-CM | POA: Diagnosis not present

## 2017-11-15 DIAGNOSIS — Z79899 Other long term (current) drug therapy: Secondary | ICD-10-CM | POA: Diagnosis not present

## 2017-11-15 DIAGNOSIS — M5136 Other intervertebral disc degeneration, lumbar region: Secondary | ICD-10-CM | POA: Diagnosis not present

## 2017-11-15 DIAGNOSIS — I10 Essential (primary) hypertension: Secondary | ICD-10-CM | POA: Diagnosis not present

## 2017-11-15 DIAGNOSIS — M545 Low back pain: Secondary | ICD-10-CM | POA: Diagnosis not present

## 2017-11-22 DIAGNOSIS — G4733 Obstructive sleep apnea (adult) (pediatric): Secondary | ICD-10-CM | POA: Diagnosis not present

## 2017-11-26 DIAGNOSIS — E119 Type 2 diabetes mellitus without complications: Secondary | ICD-10-CM | POA: Diagnosis not present

## 2017-11-26 DIAGNOSIS — I10 Essential (primary) hypertension: Secondary | ICD-10-CM | POA: Diagnosis not present

## 2017-11-26 DIAGNOSIS — G4733 Obstructive sleep apnea (adult) (pediatric): Secondary | ICD-10-CM | POA: Diagnosis not present

## 2017-11-26 DIAGNOSIS — K219 Gastro-esophageal reflux disease without esophagitis: Secondary | ICD-10-CM | POA: Diagnosis not present

## 2017-12-13 DIAGNOSIS — Z79899 Other long term (current) drug therapy: Secondary | ICD-10-CM | POA: Diagnosis not present

## 2017-12-13 DIAGNOSIS — K5903 Drug induced constipation: Secondary | ICD-10-CM | POA: Diagnosis not present

## 2017-12-13 DIAGNOSIS — M545 Low back pain: Secondary | ICD-10-CM | POA: Diagnosis not present

## 2017-12-13 DIAGNOSIS — Z0000 Encounter for general adult medical examination without abnormal findings: Secondary | ICD-10-CM | POA: Diagnosis not present

## 2017-12-13 DIAGNOSIS — M5136 Other intervertebral disc degeneration, lumbar region: Secondary | ICD-10-CM | POA: Diagnosis not present

## 2017-12-20 DIAGNOSIS — G4733 Obstructive sleep apnea (adult) (pediatric): Secondary | ICD-10-CM | POA: Diagnosis not present

## 2018-01-20 DIAGNOSIS — G4733 Obstructive sleep apnea (adult) (pediatric): Secondary | ICD-10-CM | POA: Diagnosis not present

## 2018-01-25 DIAGNOSIS — Z79899 Other long term (current) drug therapy: Secondary | ICD-10-CM | POA: Diagnosis not present

## 2018-01-25 DIAGNOSIS — M5136 Other intervertebral disc degeneration, lumbar region: Secondary | ICD-10-CM | POA: Diagnosis not present

## 2018-01-25 DIAGNOSIS — M67439 Ganglion, unspecified wrist: Secondary | ICD-10-CM | POA: Diagnosis not present

## 2018-01-25 DIAGNOSIS — I10 Essential (primary) hypertension: Secondary | ICD-10-CM | POA: Diagnosis not present

## 2018-01-25 DIAGNOSIS — M545 Low back pain: Secondary | ICD-10-CM | POA: Diagnosis not present

## 2018-02-19 DIAGNOSIS — G4733 Obstructive sleep apnea (adult) (pediatric): Secondary | ICD-10-CM | POA: Diagnosis not present

## 2018-02-21 DIAGNOSIS — M5137 Other intervertebral disc degeneration, lumbosacral region: Secondary | ICD-10-CM | POA: Diagnosis not present

## 2018-02-21 DIAGNOSIS — M1612 Unilateral primary osteoarthritis, left hip: Secondary | ICD-10-CM | POA: Diagnosis not present

## 2018-02-21 DIAGNOSIS — M25552 Pain in left hip: Secondary | ICD-10-CM | POA: Diagnosis not present

## 2018-02-21 DIAGNOSIS — Z96641 Presence of right artificial hip joint: Secondary | ICD-10-CM | POA: Diagnosis not present

## 2018-02-21 DIAGNOSIS — M5416 Radiculopathy, lumbar region: Secondary | ICD-10-CM | POA: Diagnosis not present

## 2018-02-21 DIAGNOSIS — M25551 Pain in right hip: Secondary | ICD-10-CM | POA: Diagnosis not present

## 2018-02-23 DIAGNOSIS — I10 Essential (primary) hypertension: Secondary | ICD-10-CM | POA: Diagnosis not present

## 2018-02-23 DIAGNOSIS — G4733 Obstructive sleep apnea (adult) (pediatric): Secondary | ICD-10-CM | POA: Diagnosis not present

## 2018-02-23 DIAGNOSIS — K219 Gastro-esophageal reflux disease without esophagitis: Secondary | ICD-10-CM | POA: Diagnosis not present

## 2018-02-23 DIAGNOSIS — K760 Fatty (change of) liver, not elsewhere classified: Secondary | ICD-10-CM | POA: Diagnosis not present

## 2018-02-24 DIAGNOSIS — M5136 Other intervertebral disc degeneration, lumbar region: Secondary | ICD-10-CM | POA: Diagnosis not present

## 2018-02-24 DIAGNOSIS — M545 Low back pain: Secondary | ICD-10-CM | POA: Diagnosis not present

## 2018-02-24 DIAGNOSIS — I10 Essential (primary) hypertension: Secondary | ICD-10-CM | POA: Diagnosis not present

## 2018-02-24 DIAGNOSIS — E119 Type 2 diabetes mellitus without complications: Secondary | ICD-10-CM | POA: Diagnosis not present

## 2018-03-22 DIAGNOSIS — G4733 Obstructive sleep apnea (adult) (pediatric): Secondary | ICD-10-CM | POA: Diagnosis not present

## 2018-03-28 DIAGNOSIS — M545 Low back pain: Secondary | ICD-10-CM | POA: Diagnosis not present

## 2018-03-28 DIAGNOSIS — I10 Essential (primary) hypertension: Secondary | ICD-10-CM | POA: Diagnosis not present

## 2018-03-28 DIAGNOSIS — M5136 Other intervertebral disc degeneration, lumbar region: Secondary | ICD-10-CM | POA: Diagnosis not present

## 2018-03-28 DIAGNOSIS — E559 Vitamin D deficiency, unspecified: Secondary | ICD-10-CM | POA: Diagnosis not present

## 2018-03-28 DIAGNOSIS — R300 Dysuria: Secondary | ICD-10-CM | POA: Diagnosis not present

## 2018-03-28 DIAGNOSIS — E1165 Type 2 diabetes mellitus with hyperglycemia: Secondary | ICD-10-CM | POA: Diagnosis not present

## 2018-03-28 DIAGNOSIS — M129 Arthropathy, unspecified: Secondary | ICD-10-CM | POA: Diagnosis not present

## 2018-03-28 DIAGNOSIS — E7800 Pure hypercholesterolemia, unspecified: Secondary | ICD-10-CM | POA: Diagnosis not present

## 2018-03-28 DIAGNOSIS — D539 Nutritional anemia, unspecified: Secondary | ICD-10-CM | POA: Diagnosis not present

## 2018-04-04 DIAGNOSIS — M545 Low back pain: Secondary | ICD-10-CM | POA: Diagnosis not present

## 2018-04-04 DIAGNOSIS — Z79899 Other long term (current) drug therapy: Secondary | ICD-10-CM | POA: Diagnosis not present

## 2018-04-04 DIAGNOSIS — M5136 Other intervertebral disc degeneration, lumbar region: Secondary | ICD-10-CM | POA: Diagnosis not present

## 2018-04-04 DIAGNOSIS — I10 Essential (primary) hypertension: Secondary | ICD-10-CM | POA: Diagnosis not present

## 2018-04-07 DIAGNOSIS — M5136 Other intervertebral disc degeneration, lumbar region: Secondary | ICD-10-CM | POA: Diagnosis not present

## 2018-04-21 DIAGNOSIS — G4733 Obstructive sleep apnea (adult) (pediatric): Secondary | ICD-10-CM | POA: Diagnosis not present

## 2018-05-03 DIAGNOSIS — M5136 Other intervertebral disc degeneration, lumbar region: Secondary | ICD-10-CM | POA: Diagnosis not present

## 2018-05-03 DIAGNOSIS — M545 Low back pain: Secondary | ICD-10-CM | POA: Diagnosis not present

## 2018-05-03 DIAGNOSIS — I10 Essential (primary) hypertension: Secondary | ICD-10-CM | POA: Diagnosis not present

## 2018-05-03 DIAGNOSIS — Z79899 Other long term (current) drug therapy: Secondary | ICD-10-CM | POA: Diagnosis not present

## 2018-05-30 DIAGNOSIS — E119 Type 2 diabetes mellitus without complications: Secondary | ICD-10-CM | POA: Diagnosis not present

## 2018-05-30 DIAGNOSIS — K760 Fatty (change of) liver, not elsewhere classified: Secondary | ICD-10-CM | POA: Diagnosis not present

## 2018-05-30 DIAGNOSIS — I10 Essential (primary) hypertension: Secondary | ICD-10-CM | POA: Diagnosis not present

## 2018-05-30 DIAGNOSIS — G4733 Obstructive sleep apnea (adult) (pediatric): Secondary | ICD-10-CM | POA: Diagnosis not present

## 2018-06-02 DIAGNOSIS — Z79899 Other long term (current) drug therapy: Secondary | ICD-10-CM | POA: Diagnosis not present

## 2018-06-02 DIAGNOSIS — M5136 Other intervertebral disc degeneration, lumbar region: Secondary | ICD-10-CM | POA: Diagnosis not present

## 2018-06-02 DIAGNOSIS — M545 Low back pain: Secondary | ICD-10-CM | POA: Diagnosis not present

## 2018-06-02 DIAGNOSIS — E1165 Type 2 diabetes mellitus with hyperglycemia: Secondary | ICD-10-CM | POA: Diagnosis not present

## 2018-07-04 DIAGNOSIS — M129 Arthropathy, unspecified: Secondary | ICD-10-CM | POA: Diagnosis not present

## 2018-07-04 DIAGNOSIS — Z23 Encounter for immunization: Secondary | ICD-10-CM | POA: Diagnosis not present

## 2018-07-04 DIAGNOSIS — I10 Essential (primary) hypertension: Secondary | ICD-10-CM | POA: Diagnosis not present

## 2018-07-04 DIAGNOSIS — D539 Nutritional anemia, unspecified: Secondary | ICD-10-CM | POA: Diagnosis not present

## 2018-07-04 DIAGNOSIS — E7800 Pure hypercholesterolemia, unspecified: Secondary | ICD-10-CM | POA: Diagnosis not present

## 2018-07-04 DIAGNOSIS — E1165 Type 2 diabetes mellitus with hyperglycemia: Secondary | ICD-10-CM | POA: Diagnosis not present

## 2018-07-04 DIAGNOSIS — E039 Hypothyroidism, unspecified: Secondary | ICD-10-CM | POA: Diagnosis not present

## 2018-07-04 DIAGNOSIS — R0602 Shortness of breath: Secondary | ICD-10-CM | POA: Diagnosis not present

## 2018-07-04 DIAGNOSIS — R300 Dysuria: Secondary | ICD-10-CM | POA: Diagnosis not present

## 2018-07-04 DIAGNOSIS — Z79899 Other long term (current) drug therapy: Secondary | ICD-10-CM | POA: Diagnosis not present

## 2018-07-04 DIAGNOSIS — E559 Vitamin D deficiency, unspecified: Secondary | ICD-10-CM | POA: Diagnosis not present

## 2018-07-20 DIAGNOSIS — H3540 Unspecified peripheral retinal degeneration: Secondary | ICD-10-CM | POA: Diagnosis not present

## 2018-07-20 DIAGNOSIS — E119 Type 2 diabetes mellitus without complications: Secondary | ICD-10-CM | POA: Diagnosis not present

## 2018-07-20 DIAGNOSIS — H524 Presbyopia: Secondary | ICD-10-CM | POA: Diagnosis not present

## 2018-07-20 DIAGNOSIS — H04123 Dry eye syndrome of bilateral lacrimal glands: Secondary | ICD-10-CM | POA: Diagnosis not present

## 2018-07-20 DIAGNOSIS — G43101 Migraine with aura, not intractable, with status migrainosus: Secondary | ICD-10-CM | POA: Diagnosis not present

## 2018-07-21 DIAGNOSIS — H53149 Visual discomfort, unspecified: Secondary | ICD-10-CM | POA: Diagnosis not present

## 2018-07-21 DIAGNOSIS — R51 Headache: Secondary | ICD-10-CM | POA: Diagnosis not present

## 2018-07-21 DIAGNOSIS — H538 Other visual disturbances: Secondary | ICD-10-CM | POA: Diagnosis not present

## 2018-07-25 DIAGNOSIS — H538 Other visual disturbances: Secondary | ICD-10-CM | POA: Diagnosis not present

## 2018-07-27 DIAGNOSIS — G43909 Migraine, unspecified, not intractable, without status migrainosus: Secondary | ICD-10-CM | POA: Diagnosis not present

## 2018-07-27 DIAGNOSIS — R110 Nausea: Secondary | ICD-10-CM | POA: Diagnosis not present

## 2018-07-27 DIAGNOSIS — H538 Other visual disturbances: Secondary | ICD-10-CM | POA: Diagnosis not present

## 2018-08-01 DIAGNOSIS — E1165 Type 2 diabetes mellitus with hyperglycemia: Secondary | ICD-10-CM | POA: Diagnosis not present

## 2018-08-01 DIAGNOSIS — H532 Diplopia: Secondary | ICD-10-CM | POA: Diagnosis not present

## 2018-08-01 DIAGNOSIS — H5712 Ocular pain, left eye: Secondary | ICD-10-CM | POA: Diagnosis not present

## 2018-08-01 DIAGNOSIS — R51 Headache: Secondary | ICD-10-CM | POA: Diagnosis not present

## 2018-08-24 DIAGNOSIS — H4922 Sixth [abducent] nerve palsy, left eye: Secondary | ICD-10-CM | POA: Diagnosis not present

## 2018-08-24 DIAGNOSIS — H532 Diplopia: Secondary | ICD-10-CM | POA: Diagnosis not present

## 2018-08-24 DIAGNOSIS — E039 Hypothyroidism, unspecified: Secondary | ICD-10-CM | POA: Diagnosis not present

## 2018-08-24 DIAGNOSIS — E1165 Type 2 diabetes mellitus with hyperglycemia: Secondary | ICD-10-CM | POA: Diagnosis not present

## 2018-08-29 DIAGNOSIS — H532 Diplopia: Secondary | ICD-10-CM | POA: Diagnosis not present

## 2018-08-29 DIAGNOSIS — H40013 Open angle with borderline findings, low risk, bilateral: Secondary | ICD-10-CM | POA: Diagnosis not present

## 2018-08-29 DIAGNOSIS — H3540 Unspecified peripheral retinal degeneration: Secondary | ICD-10-CM | POA: Diagnosis not present

## 2018-08-29 DIAGNOSIS — E119 Type 2 diabetes mellitus without complications: Secondary | ICD-10-CM | POA: Diagnosis not present

## 2018-08-29 DIAGNOSIS — H25013 Cortical age-related cataract, bilateral: Secondary | ICD-10-CM | POA: Diagnosis not present

## 2018-08-29 DIAGNOSIS — H53483 Generalized contraction of visual field, bilateral: Secondary | ICD-10-CM | POA: Diagnosis not present

## 2018-08-29 DIAGNOSIS — G43101 Migraine with aura, not intractable, with status migrainosus: Secondary | ICD-10-CM | POA: Diagnosis not present

## 2018-09-28 DIAGNOSIS — M5136 Other intervertebral disc degeneration, lumbar region: Secondary | ICD-10-CM | POA: Diagnosis not present

## 2018-09-28 DIAGNOSIS — M545 Low back pain: Secondary | ICD-10-CM | POA: Diagnosis not present

## 2018-09-28 DIAGNOSIS — Z79899 Other long term (current) drug therapy: Secondary | ICD-10-CM | POA: Diagnosis not present

## 2018-09-28 DIAGNOSIS — E1165 Type 2 diabetes mellitus with hyperglycemia: Secondary | ICD-10-CM | POA: Diagnosis not present

## 2018-10-11 DIAGNOSIS — Z1231 Encounter for screening mammogram for malignant neoplasm of breast: Secondary | ICD-10-CM | POA: Diagnosis not present

## 2018-10-21 DIAGNOSIS — R300 Dysuria: Secondary | ICD-10-CM | POA: Diagnosis not present

## 2018-10-21 DIAGNOSIS — M545 Low back pain: Secondary | ICD-10-CM | POA: Diagnosis not present

## 2018-10-21 DIAGNOSIS — M5136 Other intervertebral disc degeneration, lumbar region: Secondary | ICD-10-CM | POA: Diagnosis not present

## 2018-10-27 DIAGNOSIS — R112 Nausea with vomiting, unspecified: Secondary | ICD-10-CM | POA: Diagnosis not present

## 2018-10-27 DIAGNOSIS — R1013 Epigastric pain: Secondary | ICD-10-CM | POA: Diagnosis not present

## 2018-10-27 DIAGNOSIS — R011 Cardiac murmur, unspecified: Secondary | ICD-10-CM | POA: Diagnosis not present

## 2018-10-27 DIAGNOSIS — D3502 Benign neoplasm of left adrenal gland: Secondary | ICD-10-CM | POA: Diagnosis not present

## 2018-10-27 DIAGNOSIS — R197 Diarrhea, unspecified: Secondary | ICD-10-CM | POA: Diagnosis not present

## 2018-11-02 DIAGNOSIS — Z09 Encounter for follow-up examination after completed treatment for conditions other than malignant neoplasm: Secondary | ICD-10-CM | POA: Diagnosis not present

## 2018-11-02 DIAGNOSIS — R300 Dysuria: Secondary | ICD-10-CM | POA: Diagnosis not present

## 2018-11-02 DIAGNOSIS — E7800 Pure hypercholesterolemia, unspecified: Secondary | ICD-10-CM | POA: Diagnosis not present

## 2018-11-02 DIAGNOSIS — R109 Unspecified abdominal pain: Secondary | ICD-10-CM | POA: Diagnosis not present

## 2018-11-02 DIAGNOSIS — I10 Essential (primary) hypertension: Secondary | ICD-10-CM | POA: Diagnosis not present

## 2018-11-02 DIAGNOSIS — M545 Low back pain: Secondary | ICD-10-CM | POA: Diagnosis not present

## 2018-11-02 DIAGNOSIS — D539 Nutritional anemia, unspecified: Secondary | ICD-10-CM | POA: Diagnosis not present

## 2018-11-02 DIAGNOSIS — Z79899 Other long term (current) drug therapy: Secondary | ICD-10-CM | POA: Diagnosis not present

## 2018-11-02 DIAGNOSIS — E039 Hypothyroidism, unspecified: Secondary | ICD-10-CM | POA: Diagnosis not present

## 2018-11-02 DIAGNOSIS — R011 Cardiac murmur, unspecified: Secondary | ICD-10-CM | POA: Diagnosis not present

## 2018-11-02 DIAGNOSIS — M129 Arthropathy, unspecified: Secondary | ICD-10-CM | POA: Diagnosis not present

## 2018-11-02 DIAGNOSIS — E1165 Type 2 diabetes mellitus with hyperglycemia: Secondary | ICD-10-CM | POA: Diagnosis not present

## 2018-11-02 DIAGNOSIS — E559 Vitamin D deficiency, unspecified: Secondary | ICD-10-CM | POA: Diagnosis not present

## 2018-11-11 DIAGNOSIS — R011 Cardiac murmur, unspecified: Secondary | ICD-10-CM | POA: Diagnosis not present

## 2018-11-11 DIAGNOSIS — R0602 Shortness of breath: Secondary | ICD-10-CM | POA: Diagnosis not present

## 2018-11-30 DIAGNOSIS — G4733 Obstructive sleep apnea (adult) (pediatric): Secondary | ICD-10-CM | POA: Diagnosis not present

## 2018-11-30 DIAGNOSIS — I10 Essential (primary) hypertension: Secondary | ICD-10-CM | POA: Diagnosis not present

## 2018-11-30 DIAGNOSIS — M5136 Other intervertebral disc degeneration, lumbar region: Secondary | ICD-10-CM | POA: Diagnosis not present

## 2018-12-01 ENCOUNTER — Encounter: Payer: Self-pay | Admitting: Neurology

## 2018-12-01 ENCOUNTER — Ambulatory Visit: Payer: Medicare HMO | Admitting: Neurology

## 2018-12-01 VITALS — BP 142/75 | HR 57 | Ht 70.0 in | Wt 236.0 lb

## 2018-12-01 DIAGNOSIS — G43001 Migraine without aura, not intractable, with status migrainosus: Secondary | ICD-10-CM

## 2018-12-01 DIAGNOSIS — R51 Headache: Secondary | ICD-10-CM

## 2018-12-01 DIAGNOSIS — R519 Headache, unspecified: Secondary | ICD-10-CM

## 2018-12-01 DIAGNOSIS — R2990 Unspecified symptoms and signs involving the nervous system: Secondary | ICD-10-CM | POA: Diagnosis not present

## 2018-12-01 DIAGNOSIS — H93A2 Pulsatile tinnitus, left ear: Secondary | ICD-10-CM | POA: Diagnosis not present

## 2018-12-01 DIAGNOSIS — H4920 Sixth [abducent] nerve palsy, unspecified eye: Secondary | ICD-10-CM | POA: Diagnosis not present

## 2018-12-01 DIAGNOSIS — H539 Unspecified visual disturbance: Secondary | ICD-10-CM

## 2018-12-01 DIAGNOSIS — H532 Diplopia: Secondary | ICD-10-CM

## 2018-12-01 NOTE — Progress Notes (Signed)
[facility]    Provider:  Dr Jaynee Eagles Referring Provider: Dr Charna Archer Primary Care Provider:  Dr Charna Archer  CC:  headache that affected sight in left eye  HPI:  Jean Hamilton is a 68 y.o. female here as requested by provider Anda Kraft, MD for diplopia. 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 She has a history migraines long time ago. She does have "small headaches" like small headaches but this headache started in the back of the eyes, both eyes, pulsating, pounding, kept getting worse, then developed noise and light sensitivity, then diplopia. Lasted for over a month and now gone, the last time she had the headache was . She went to her eye doctor and he checked her but she doesn't remember what they told her. Dr. Holley Raring saw a 6th nerve palsy. Last time she had the symptom or a headache was a month ago. No more headaches or double vision, everything has been fine. She says the left eye wouldn't move all the way over to her left. Diplopia was intermittent. She has pulsing in the ear and ringing. No other facial weakness, swallowing difficulty, focal neuro deficits. No other focal neurologic deficits, associated symptoms, inciting events or modifiable factors.  Reviewed notes, labs and imaging from outside physicians, which showed:  Reviewed notes from Dr. Cori Razor.  Patient presented with a complaint of eye pain.  Occurring in a persistent pattern for several weeks.  Described as moderate and located in the right eye.  Aggravated by position changes with associated headache.  She also had the onset of double vision during this time.  It is characterized as a blurring of vision and affects the right eye and associated  with diplopia.  She is receiving chronic opioids for headache, neck pain, back pain and lower leg pain.  Average pain level of 8 out of 10, minimum pain level 3 out of 10 and maximum 10 out of 10.  The pain is constant.  Pain exacerbations occur monthly.  Symptoms worsening.  Review of Systems: Patient complains of symptoms per HPI as well as the following symptoms: snoring, constipation, cramps, aching muscles. Pertinent negatives and positives per HPI. All others negative.   Social History   Socioeconomic History  . Marital status: Married    Spouse name: Not on file  . Number of children: Not on file  . Years of education: Not on file  . Highest education level: Associate degree: occupational, Hotel manager, or vocational program  Occupational History  . Not on file  Social Needs  . Financial resource strain: Not on file  . Food insecurity:    Worry: Not on file    Inability: Not on file  . Transportation needs:    Medical: Not on file    Non-medical: Not on file  Tobacco Use  . Smoking status: Former Smoker    Packs/day: 0.25    Years: 20.00    Pack years: 5.00    Types: Cigarettes    Last attempt to quit: 1988    Years since quitting: 32.1  . Smokeless tobacco: Never Used  Substance and Sexual Activity  . Alcohol use: No  . Drug use: No  . Sexual activity: Not on file  Lifestyle  . Physical activity:    Days per week: Not on  file    Minutes per session: Not on file  . Stress: Not on file  Relationships  . Social connections:    Talks on phone: Not on file    Gets together: Not on file    Attends religious service: Not on file    Active member of club or organization: Not on file    Attends meetings of clubs or organizations: Not on file    Relationship status: Not on file  . Intimate partner violence:    Fear of current or ex partner: Not on file    Emotionally abused: Not on file    Physically abused: Not on file    Forced sexual activity: Not on file  Other  Topics Concern  . Not on file  Social History Narrative   Lives at home with her husband   Right handed   Caffeine: coffee, 1 cup daily    Family History  Problem Relation Age of Onset  . Other Mother        bowel blockage  . Congestive Heart Failure Mother   . Uterine cancer Mother   . High blood pressure Brother   . High blood pressure Other        mother's side  . High blood pressure Brother   . High blood pressure Brother   . Lung cancer Cousin        smoker  . Liver cancer Cousin   . Cancer Cousin   . Breast cancer Maternal Grandmother   . Migraines Neg Hx   . Headache Neg Hx     Past Medical History:  Diagnosis Date  . Acid reflux   . Arthritis    oa  . Diabetes mellitus without complication ([place])   . GERD (gastroesophageal reflux disease)   . Hypercholesterolemia   . Hyperlipidemia   . Hypertension   . Hypothyroidism   . Hypothyroidism   . Low back pain   . Numbness last few weeks   left index finger  . Sleep apnea    uses CPAP every night   . Vitamin D deficiency     Patient Active Problem List   Diagnosis Date Noted  . Migraine without aura and with status migrainosus, not intractable 12/04/2018  . GERD (gastroesophageal reflux disease) 01/17/2014  . Unspecified hypothyroidism 01/12/2014  . Hypertension   . Diabetes mellitus without complication ([place])   . Hyperlipidemia   . Acute blood loss anemia 01/05/2014  . Osteoarthritis of right hip 01/03/2014  . History of total right hip arthroplasty 01/03/2014    Past Surgical History:  Procedure Laterality Date  . ABDOMINAL HYSTERECTOMY  1981   1 ovary removed  . ganglion cyst removed Right yrs ago   wrist  . HAMMER TOE SURGERY Left few yrs ago   and left foot surgery  . oopherectomy Bilateral   . SALPINGECTOMY    . TOTAL HIP ARTHROPLASTY Right 01/03/2014   Procedure: RIGHT TOTAL HIP ARTHROPLASTY;  Surgeon: Tobi Bastos, MD;  Location: [facility];  Service: Orthopedics;  Laterality: Right;     Current Outpatient Medications  Medication Sig Dispense Refill  . amLODipine (NORVASC) 5 MG tablet Take 5 mg by mouth daily.    Marland Kitchen aspirin 81 MG chewable tablet Chew 81 mg by mouth daily.    Marland Kitchen esomeprazole (NEXIUM) 20 MG capsule Take 20 mg by mouth daily as needed.     . furosemide (LASIX) 20 MG tablet Take 20 mg by mouth daily.     Marland Kitchen  HYDROcodone-acetaminophen (NORCO/VICODIN) 5-325 MG per tablet Take 1-2 tablets by mouth every 4 (four) hours as needed for moderate pain. 30 tablet 0  . levothyroxine (SYNTHROID, LEVOTHROID) 150 MCG tablet Take 150 mcg by mouth daily before breakfast.    . LINZESS 145 MCG CAPS capsule Take 145 mcg by mouth daily as needed.     . metFORMIN (GLUCOPHAGE-XR) 500 MG 24 hr tablet Take 1,000 mg by mouth daily with breakfast.    . olmesartan (BENICAR) 40 MG tablet     . omeprazole (PRILOSEC) 40 MG capsule Take 40 mg by mouth daily as needed.     . simvastatin (ZOCOR) 40 MG tablet Take 40 mg by mouth every morning.    Marland Kitchen VITAMIN D PO Take by mouth.    Marland Kitchen VITAMIN E PO Take by mouth.    . ferrous sulfate 325 (65 FE) MG tablet Take 1 tablet (325 mg total) by mouth 3 (three) times daily after meals. (Patient not taking: Reported on 12/01/2018) 30 tablet 0  . rivaroxaban (XARELTO) 10 MG TABS tablet Take 1 tablet (10 mg total) by mouth daily with breakfast. (Patient not taking: Reported on 12/01/2018) 18 tablet 0   No current facility-administered medications for this visit.     Allergies as of 12/01/2018 - Review Complete 12/01/2018  Allergen Reaction Noted  . Oxycodone Itching and Other (See Comments) 01/03/2014  . Sulfa antibiotics Swelling 12/25/2013    Vitals: BP (!) 142/75 (BP Location: Right Arm, Patient Position: Sitting)   Pulse (!) 57   Ht _0  (1.778 m)   Wt 236 lb (107 kg)   BMI 33.86 kg/m  Last Weight:  Wt Readings from Last 1 Encounters:  12/01/18 236 lb (107 kg)   Last Height:   Ht Readings from Last 1 Encounters:  12/01/18 _1  (1.778 m)      Physical exam: Exam: Gen: NAD, conversant, well nourised, obese, well groomed                     CV: RRR, no MRG. No Carotid Bruits. No peripheral edema, warm, nontender Eyes: Conjunctivae clear without exudates or hemorrhage  Neuro: Detailed Neurologic Exam  Speech:    Speech is normal; fluent and spontaneous with normal comprehension.  Cognition:    The patient is oriented to person, place, and time;     recent and remote memory intact;     language fluent;     normal attention, concentration,     fund of knowledge Cranial Nerves:    The pupils are equal, round, and reactive to light. The fundi are normal and spontaneous venous pulsations are present. Visual fields are full to finger confrontation. Extraocular movements are intact. Trigeminal sensation is intact and the muscles of mastication are normal. The face is symmetric. The palate elevates in the midline. Hearing intact. Voice is normal. Shoulder shrug is normal. The tongue has normal motion without fasciculations.   Coordination:    Normal finger to nose and heel to shin. Normal rapid alternating movements.   Gait:    Antalgic and stooped  Motor Observation:    No asymmetry, no atrophy, and no involuntary movements noted. Tone:    Normal muscle tone.    Posture:    Posture is normal. normal erect    Strength:    Strength is V/V in the upper and lower limbs.      Sensation: intact to LT     Reflex Exam:  DTR's:  Deep tendon reflexes in the upper and lower extremities are brisk bilaterally.   Toes:    The toes are downgoing bilaterally.   Clonus:    Clonus is absent.    Assessment/Plan:  68 year old with new onset headache and diplopia. May be migrainous but needs thorough evaluation for concerning symptoms:  MRI brain to evaluate for stroke or brainstem lesion MRA of the head to evaluate for stroke-like symptoms and cerebrovascular disease also what sounds like pulsatile tinnitus check for  aneurysm Esr/crp for [facility] screen  Orders Placed This Encounter  Procedures  . MR BRAIN W WO CONTRAST  . MR MRA HEAD WO CONTRAST  . Basic Metabolic Panel  . Sedimentation rate  . C-reactive protein   Discussed: To prevent or relieve headaches, try the following: Cool Compress. Lie down and place a cool compress on your head.  Avoid headache triggers. If certain foods or odors seem to have triggered your migraines in the past, avoid them. A headache diary might help you identify triggers.  Include physical activity in your daily routine. Try a daily walk or other moderate aerobic exercise.  Manage stress. Find healthy ways to cope with the stressors, such as delegating tasks on your to-do list.  Practice relaxation techniques. Try deep breathing, yoga, massage and visualization.  Eat regularly. Eating regularly scheduled meals and maintaining a healthy diet might help prevent headaches. Also, drink plenty of fluids.  Follow a regular sleep schedule. Sleep deprivation might contribute to headaches Consider biofeedback. With this mind-body technique, you learn to control certain bodily functions - such as muscle tension, heart rate and blood pressure - to prevent headaches or reduce headache pain.    Proceed to emergency room if you experience new or worsening symptoms or symptoms do not resolve, if you have new neurologic symptoms or if headache is severe, or for any concerning symptom.   Provided education and documentation from American headache Society toolbox including articles on: chronic migraine medication overuse headache, chronic migraines, prevention of migraines, behavioral and other nonpharmacologic treatments for headache.   Cc: Dr Janine Ores, MD  [facility] [address] [place], [place] [address]  Phone [phone] Fax [phone]

## 2018-12-01 NOTE — Patient Instructions (Signed)
MRI of the brain and MRA of the blood vessels of the head If any symptoms call 911!!   Migraine Headache A migraine headache is an intense, throbbing pain on one side or both sides of the head. Migraines may also cause other symptoms, such as nausea, vomiting, and sensitivity to light and noise. What are the causes? Doing or taking certain things may also trigger migraines, such as:  Alcohol.  Smoking.  Medicines, such as: ? Medicine used to treat chest pain (nitroglycerine). ? Birth control pills. ? Estrogen pills. ? Certain blood pressure medicines.  Aged cheeses, chocolate, or caffeine.  Foods or drinks that contain nitrates, glutamate, aspartame, or tyramine.  Physical activity. Other things that may trigger a migraine include:  Menstruation.  Pregnancy.  Hunger.  Stress, lack of sleep, too much sleep, or fatigue.  Weather changes. What increases the risk? The following factors may make you more likely to experience migraine headaches:  Age. Risk increases with age.  Family history of migraine headaches.  Being Caucasian.  Depression and anxiety.  Obesity.  Being a woman.  Having a hole in the heart (patent foramen ovale) or other heart problems. What are the signs or symptoms? The main symptom of this condition is pulsating or throbbing pain. Pain may:  Happen in any area of the head, such as on one side or both sides.  Interfere with daily activities.  Get worse with physical activity.  Get worse with exposure to bright lights or loud noises. Other symptoms may include:  Nausea.  Vomiting.  Dizziness.  General sensitivity to bright lights, loud noises, or smells. Before you get a migraine, you may get warning signs that a migraine is developing (aura). An aura may include:  Seeing flashing lights or having blind spots.  Seeing bright spots, halos, or zigzag lines.  Having tunnel vision or blurred vision.  Having numbness or a  tingling feeling.  Having trouble talking.  Having muscle weakness. How is this diagnosed? A migraine headache can be diagnosed based on:  Your symptoms.  A physical exam.  Tests, such as CT scan or MRI of the head. These imaging tests can help rule out other causes of headaches.  Taking fluid from the spine (lumbar puncture) and analyzing it (cerebrospinal fluid analysis, or CSF analysis). How is this treated? A migraine headache is usually treated with medicines that:  Relieve pain.  Relieve nausea.  Prevent migraines from coming back. Treatment may also include:  Acupuncture.  Lifestyle changes like avoiding foods that trigger migraines. Follow these instructions at home: Medicines  Take over-the-counter and prescription medicines only as told by your health care provider.  Do not drive or use heavy machinery while taking prescription pain medicine.  To prevent or treat constipation while you are taking prescription pain medicine, your health care provider may recommend that you: ? Drink enough fluid to keep your urine clear or pale yellow. ? Take over-the-counter or prescription medicines. ? Eat foods that are high in fiber, such as fresh fruits and vegetables, whole grains, and beans. ? Limit foods that are high in fat and processed sugars, such as fried and sweet foods. Lifestyle  Avoid alcohol use.  Do not use any products that contain nicotine or tobacco, such as cigarettes and e-cigarettes. If you need help quitting, ask your health care provider.  Get at least 8 hours of sleep every night.  Limit your stress. General instructions      Keep a journal to find out what  may trigger your migraine headaches. For example, write down: ? What you eat and drink. ? How much sleep you get. ? Any change to your diet or medicines.  If you have a migraine: ? Avoid things that make your symptoms worse, such as bright lights. ? It may help to lie down in a dark,  quiet room. ? Do not drive or use heavy machinery. ? Ask your health care provider what activities are safe for you while you are experiencing symptoms.  Keep all follow-up visits as told by your health care provider. This is important. Contact a health care provider if:  You develop symptoms that are different or more severe than your usual migraine symptoms. Get help right away if:  Your migraine becomes severe.  You have a fever.  You have a stiff neck.  You have vision loss.  Your muscles feel weak or like you cannot control them.  You start to lose your balance often.  You develop trouble walking.  You faint. This information is not intended to replace advice given to you by your health care provider. Make sure you discuss any questions you have with your health care provider. Document Released: 09/28/2005 Document Revised: 04/17/2016 Document Reviewed: 03/16/2016 Elsevier Interactive Patient Education  2019 [employer].

## 2018-12-02 DIAGNOSIS — I10 Essential (primary) hypertension: Secondary | ICD-10-CM | POA: Diagnosis not present

## 2018-12-02 DIAGNOSIS — M545 Low back pain: Secondary | ICD-10-CM | POA: Diagnosis not present

## 2018-12-02 DIAGNOSIS — Z79899 Other long term (current) drug therapy: Secondary | ICD-10-CM | POA: Diagnosis not present

## 2018-12-02 DIAGNOSIS — M5136 Other intervertebral disc degeneration, lumbar region: Secondary | ICD-10-CM | POA: Diagnosis not present

## 2018-12-02 LAB — SEDIMENTATION RATE: Sed Rate: 28 mm/hr (ref 0–40)

## 2018-12-02 LAB — BASIC METABOLIC PANEL
BUN / CREAT RATIO: 8 — AB (ref 12–28)
BUN: 7 mg/dL — ABNORMAL LOW (ref 8–27)
CO2: 23 mmol/L (ref 20–29)
CREATININE: 0.87 mg/dL (ref 0.57–1.00)
Calcium: 9.1 mg/dL (ref 8.7–10.3)
Chloride: 105 mmol/L (ref 96–106)
GFR calc non Af Amer: 68 mL/min/{1.73_m2} (ref >59)
GFR, EST AFRICAN AMERICAN: 79 mL/min/{1.73_m2} (ref >59)
Glucose: 112 mg/dL — ABNORMAL HIGH (ref 65–99)
Potassium: 4.2 mmol/L (ref 3.5–5.2)
SODIUM: 142 mmol/L (ref 134–144)

## 2018-12-02 LAB — C-REACTIVE PROTEIN: CRP: <1 mg/L (ref 0–10)

## 2018-12-04 ENCOUNTER — Encounter: Payer: Self-pay | Admitting: Neurology

## 2018-12-04 DIAGNOSIS — G43001 Migraine without aura, not intractable, with status migrainosus: Secondary | ICD-10-CM | POA: Insufficient documentation

## 2018-12-05 ENCOUNTER — Telehealth: Payer: Self-pay | Admitting: Neurology

## 2018-12-05 NOTE — Telephone Encounter (Signed)
Jean Hamilton Josem Kaufmann: [id number] (exp. 12/05/18 to 01/04/19) order sent to GI. They will reach out to the pt to schedule.

## 2018-12-17 ENCOUNTER — Ambulatory Visit
Admission: RE | Admit: 2018-12-17 | Discharge: 2018-12-17 | Disposition: A | Discharge status: Home / Self Care | Payer: Medicare HMO | Source: Ambulatory Visit | Attending: Neurology | Admitting: Neurology

## 2018-12-17 DIAGNOSIS — R2990 Unspecified symptoms and signs involving the nervous system: Secondary | ICD-10-CM

## 2018-12-17 DIAGNOSIS — H93A2 Pulsatile tinnitus, left ear: Secondary | ICD-10-CM

## 2018-12-17 DIAGNOSIS — R519 Headache, unspecified: Secondary | ICD-10-CM

## 2018-12-17 DIAGNOSIS — R51 Headache: Secondary | ICD-10-CM

## 2018-12-17 DIAGNOSIS — H532 Diplopia: Secondary | ICD-10-CM

## 2018-12-17 DIAGNOSIS — H539 Unspecified visual disturbance: Secondary | ICD-10-CM

## 2018-12-17 DIAGNOSIS — H4920 Sixth [abducent] nerve palsy, unspecified eye: Secondary | ICD-10-CM

## 2018-12-17 MED ORDER — GADOBENATE DIMEGLUMINE 529 MG/ML IV SOLN
20.0000 mL | Freq: Once | INTRAVENOUS | Status: AC | PRN
  Administered 2018-12-17: 20 mL via INTRAVENOUS

## 2018-12-19 ENCOUNTER — Telehealth: Payer: Self-pay | Admitting: *Deleted

## 2018-12-19 NOTE — Telephone Encounter (Signed)
-----   Message from Melvenia Beam, MD sent at 12/18/2018  8:09 PM EDT ----- MRI brain and MRA of the head are normal for age

## 2018-12-19 NOTE — Telephone Encounter (Signed)
LMOM (identified vm, and ok per hippa) that MRI brain and MRA head are normal for age. She does not need to return this call unless she has questions/fim

## 2018-12-19 NOTE — Telephone Encounter (Signed)
[facility] for MRI results/fim

## 2018-12-30 DIAGNOSIS — M545 Low back pain: Secondary | ICD-10-CM | POA: Diagnosis not present

## 2018-12-30 DIAGNOSIS — D3500 Benign neoplasm of unspecified adrenal gland: Secondary | ICD-10-CM | POA: Diagnosis not present

## 2018-12-30 DIAGNOSIS — M5136 Other intervertebral disc degeneration, lumbar region: Secondary | ICD-10-CM | POA: Diagnosis not present

## 2018-12-30 DIAGNOSIS — Z79899 Other long term (current) drug therapy: Secondary | ICD-10-CM | POA: Diagnosis not present

## 2018-12-30 DIAGNOSIS — E7800 Pure hypercholesterolemia, unspecified: Secondary | ICD-10-CM | POA: Diagnosis not present

## 2019-01-05 DIAGNOSIS — D3500 Benign neoplasm of unspecified adrenal gland: Secondary | ICD-10-CM | POA: Diagnosis not present

## 2019-01-05 DIAGNOSIS — M5136 Other intervertebral disc degeneration, lumbar region: Secondary | ICD-10-CM | POA: Diagnosis not present

## 2019-01-30 DIAGNOSIS — E559 Vitamin D deficiency, unspecified: Secondary | ICD-10-CM | POA: Diagnosis not present

## 2019-01-30 DIAGNOSIS — R5383 Other fatigue: Secondary | ICD-10-CM | POA: Diagnosis not present

## 2019-01-30 DIAGNOSIS — G8929 Other chronic pain: Secondary | ICD-10-CM | POA: Diagnosis not present

## 2019-01-30 DIAGNOSIS — E1165 Type 2 diabetes mellitus with hyperglycemia: Secondary | ICD-10-CM | POA: Diagnosis not present

## 2019-01-30 DIAGNOSIS — Z79899 Other long term (current) drug therapy: Secondary | ICD-10-CM | POA: Diagnosis not present

## 2019-01-30 DIAGNOSIS — R300 Dysuria: Secondary | ICD-10-CM | POA: Diagnosis not present

## 2019-01-30 DIAGNOSIS — M25559 Pain in unspecified hip: Secondary | ICD-10-CM | POA: Diagnosis not present

## 2019-01-30 DIAGNOSIS — E7800 Pure hypercholesterolemia, unspecified: Secondary | ICD-10-CM | POA: Diagnosis not present

## 2019-01-30 DIAGNOSIS — D539 Nutritional anemia, unspecified: Secondary | ICD-10-CM | POA: Diagnosis not present

## 2019-01-30 DIAGNOSIS — M129 Arthropathy, unspecified: Secondary | ICD-10-CM | POA: Diagnosis not present

## 2019-02-23 ENCOUNTER — Telehealth: Payer: Self-pay

## 2019-02-23 NOTE — Telephone Encounter (Signed)
Spoke with the patient and they have given verbal consent to file their insurance and to do a doxy.me visit. E-mail, mobile number and carrier have been confirmed and sent.  E-mail: [email or web address] Text: [phone] ([employer])

## 2019-03-01 ENCOUNTER — Ambulatory Visit (INDEPENDENT_AMBULATORY_CARE_PROVIDER_SITE_OTHER): Payer: Medicare HMO | Admitting: Family Medicine

## 2019-03-01 ENCOUNTER — Encounter: Payer: Self-pay | Admitting: Family Medicine

## 2019-03-01 ENCOUNTER — Other Ambulatory Visit: Payer: Self-pay

## 2019-03-01 DIAGNOSIS — H4920 Sixth [abducent] nerve palsy, unspecified eye: Secondary | ICD-10-CM

## 2019-03-01 NOTE — Progress Notes (Signed)
Made any corrections needed, and agree with history, physical, neuro exam,assessment and plan as stated.     Antonia Ahern, MD [facility]  

## 2019-03-01 NOTE — Progress Notes (Signed)
   PATIENT: Jean Hamilton DOB: January 25, 1951  REASON FOR VISIT: follow up HISTORY FROM: patient  Virtual Visit via Telephone Note  I connected with Jean Hamilton on 03/01/19 at  8:30 AM EDT by telephone and verified that I am speaking with the correct person using two identifiers.   I discussed the limitations, risks, security and privacy concerns of performing an evaluation and management service by telephone and the availability of in person appointments. I also discussed with the patient that there may be a patient responsible charge related to this service. The patient expressed understanding and agreed to proceed.   History of Present Illness:  03/01/19 Jean Hamilton is a 68 y.o. female for follow up of cranial nerve VI palsy. She reports that she is doing very well. She states that diplopia resolved and has not returned. She has not had any more headaches. She has followed up with PCP and ophthalmology with normal reports.    Observations/Objective:  Generalized: Well developed, in no acute distress  Mentation: Alert oriented to time, place, history taking. Follows all commands speech and language fluent   Assessment and Plan:  68 y.o. year old female  has a past medical history of Acid reflux, Arthritis, Diabetes mellitus without complication ([place]), GERD (gastroesophageal reflux disease), Hypercholesterolemia, Hyperlipidemia, Hypertension, Hypothyroidism, Hypothyroidism, Low back pain, Numbness (last few weeks), Sleep apnea, and Vitamin D deficiency. here with    ICD-10-CM   1. Sixth nerve palsy, unspecified laterality H49.20    She has had total resolution of diplopia and headaches. She denies any visual disturbances since last visit. She is feeling well. We will continue to monitor. She was advised to call for any new or worsening symptoms. Follow up as needed.   No orders of the defined types were placed in this encounter.   No orders of the defined types were placed in  this encounter.    Follow Up Instructions:  I discussed the assessment and treatment plan with the patient. The patient was provided an opportunity to ask questions and all were answered. The patient agreed with the plan and demonstrated an understanding of the instructions.   The patient was advised to call back or seek an in-person evaluation if the symptoms worsen or if the condition fails to improve as anticipated.  I provided 20 minutes of non-face-to-face time during this encounter.  Patient is located at her place of residence during video conference.  Provider is located at the place of residence.  Liane Comber, RN helped to facilitate visit.   Debbora Presto, NP
